# Patient Record
Sex: Female | Born: 1945
Health system: Southern US, Community
[De-identification: ages and names within clinical notes are randomized; demographics above are authoritative.]

## PROBLEM LIST (undated history)

## (undated) DIAGNOSIS — H269 Unspecified cataract: Secondary | ICD-10-CM

## (undated) DIAGNOSIS — K635 Polyp of colon: Secondary | ICD-10-CM

## (undated) DIAGNOSIS — M810 Age-related osteoporosis without current pathological fracture: Secondary | ICD-10-CM

## (undated) DIAGNOSIS — M1990 Unspecified osteoarthritis, unspecified site: Secondary | ICD-10-CM

## (undated) HISTORY — DX: Unspecified cataract: H26.9

## (undated) HISTORY — DX: Polyp of colon: K63.5

## (undated) HISTORY — DX: Unspecified osteoarthritis, unspecified site: M19.90

## (undated) HISTORY — DX: Age-related osteoporosis without current pathological fracture: M81.0

---

## 1974-04-12 HISTORY — PX: TUBAL LIGATION: SHX77

## 2003-03-18 ENCOUNTER — Other Ambulatory Visit: Admission: RE | Admit: 2003-03-18 | Discharge: 2003-03-18 | Payer: Self-pay | Admitting: Obstetrics and Gynecology

## 2003-04-29 HISTORY — PX: COLONOSCOPY: SHX174

## 2004-12-09 ENCOUNTER — Other Ambulatory Visit: Admission: RE | Admit: 2004-12-09 | Discharge: 2004-12-09 | Payer: Self-pay | Admitting: Obstetrics and Gynecology

## 2006-04-12 HISTORY — PX: ACOUSTIC NEUROMA RESECTION: SHX5713

## 2006-09-13 ENCOUNTER — Encounter: Admission: RE | Admit: 2006-09-13 | Discharge: 2006-09-13 | Payer: Self-pay | Admitting: Otolaryngology

## 2008-03-29 ENCOUNTER — Encounter: Admission: RE | Admit: 2008-03-29 | Discharge: 2008-03-29 | Payer: Self-pay | Admitting: Otolaryngology

## 2013-05-17 ENCOUNTER — Encounter: Payer: Self-pay | Admitting: Internal Medicine

## 2013-09-12 ENCOUNTER — Encounter: Payer: Self-pay | Admitting: Internal Medicine

## 2015-05-23 ENCOUNTER — Ambulatory Visit (AMBULATORY_SURGERY_CENTER): Payer: Self-pay | Admitting: *Deleted

## 2015-05-23 VITALS — Ht 65.5 in | Wt 118.0 lb

## 2015-05-23 DIAGNOSIS — Z1211 Encounter for screening for malignant neoplasm of colon: Secondary | ICD-10-CM

## 2015-05-23 NOTE — Progress Notes (Signed)
No egg or soy allergy known to patient  No issues with past sedation with any surgeries  or procedures, no intubation problems  No diet pills per patient No home 02 use per patient  No blood thinners per patient    

## 2015-06-04 ENCOUNTER — Encounter: Payer: Self-pay | Admitting: Gastroenterology

## 2015-06-04 ENCOUNTER — Ambulatory Visit (AMBULATORY_SURGERY_CENTER): Payer: Medicare Other | Admitting: Gastroenterology

## 2015-06-04 VITALS — BP 109/70 | HR 61 | Temp 97.1°F | Resp 13 | Ht 65.5 in | Wt 116.0 lb

## 2015-06-04 DIAGNOSIS — Z1211 Encounter for screening for malignant neoplasm of colon: Secondary | ICD-10-CM

## 2015-06-04 DIAGNOSIS — D120 Benign neoplasm of cecum: Secondary | ICD-10-CM

## 2015-06-04 MED ORDER — SODIUM CHLORIDE 0.9 % IV SOLN: 500.0000 mL | INTRAVENOUS | Status: DC

## 2015-06-04 NOTE — Patient Instructions (Signed)
Discharge instructions given. Handouts on polyps and diverticulosis. Resume previous medications. YOU HAD AN ENDOSCOPIC PROCEDURE TODAY AT [facility]:   Refer to the procedure report that was given to you for any specific questions about what was found during the examination.  If the procedure report does not answer your questions, please call your gastroenterologist to clarify.  If you requested that your care partner not be given the details of your procedure findings, then the procedure report has been included in a sealed envelope for you to review at your convenience later.  YOU SHOULD EXPECT: Some feelings of bloating in the abdomen. Passage of more gas than usual.  Walking can help get rid of the air that was put into your GI tract during the procedure and reduce the bloating. If you had a lower endoscopy (such as a colonoscopy or flexible sigmoidoscopy) you may notice spotting of blood in your stool or on the toilet paper. If you underwent a bowel prep for your procedure, you may not have a normal bowel movement for a few days.  Please Note:  You might notice some irritation and congestion in your nose or some drainage.  This is from the oxygen used during your procedure.  There is no need for concern and it should clear up in a day or so.  SYMPTOMS TO REPORT IMMEDIATELY:   Following lower endoscopy (colonoscopy or flexible sigmoidoscopy):  Excessive amounts of blood in the stool  Significant tenderness or worsening of abdominal pains  Swelling of the abdomen that is new, acute  Fever of 100F or higher   Following upper endoscopy (EGD)  Vomiting of blood or coffee ground material  New chest pain or pain under the shoulder blades  Painful or persistently difficult swallowing  New shortness of breath  Fever of 100F or higher  Black, tarry-looking stools  For urgent or emergent issues, a gastroenterologist can be reached at any hour by calling [phone]  [phone].   DIET: Your first meal following the procedure should be a small meal and then it is ok to progress to your normal diet. Heavy or fried foods are harder to digest and may make you feel nauseous or bloated.  Likewise, meals heavy in dairy and vegetables can increase bloating.  Drink plenty of fluids but you should avoid alcoholic beverages for 24 hours.  ACTIVITY:  You should plan to take it easy for the rest of today and you should NOT DRIVE or use heavy machinery until tomorrow (because of the sedation medicines used during the test).    FOLLOW UP: Our staff will call the number listed on your records the next business day following your procedure to check on you and address any questions or concerns that you may have regarding the information given to you following your procedure. If we do not reach you, we will leave a message.  However, if you are feeling well and you are not experiencing any problems, there is no need to return our call.  We will assume that you have returned to your regular daily activities without incident.  If any biopsies were taken you will be contacted by phone or by letter within the next 1-3 weeks.  Please call us at [phone] if you have not heard about the biopsies in 3 weeks.    SIGNATURES/CONFIDENTIALITY: You and/or your care partner have signed paperwork which will be entered into your electronic medical record.  These signatures attest to the fact that that  the information above on your After Visit Summary has been reviewed and is understood.  Full responsibility of the confidentiality of this discharge information lies with you and/or your care-partner.

## 2015-06-04 NOTE — Progress Notes (Signed)
Report to PACU, RN, vss, BBS= Clear.  

## 2015-06-04 NOTE — Progress Notes (Signed)
Called to room to assist during endoscopic procedure.  Patient ID and intended procedure confirmed with present staff. Received instructions for my participation in the procedure from the performing physician.  

## 2015-06-04 NOTE — Op Note (Signed)
[place]  [address] [place], [id number]   COLONOSCOPY PROCEDURE REPORT  PATIENT: Diana Stephens, Diana Stephens  MR#: [id number] BIRTHDATE: [birth year] , 69  yrs. old GENDER: female ENDOSCOPIST: Wilfrid Lund, MD REFERRED [id number] Ross, M.D. PROCEDURE DATE:  06/04/2015 PROCEDURE:   Colonoscopy, screening and Colonoscopy with biopsy normal colonoscopy January 2005 ASA CLASS:   Class I INDICATIONS:Screening for colonic neoplasia. MEDICATIONS: Monitored anesthesia care and Propofol 200 mg IV  DESCRIPTION OF PROCEDURE:   After the risks benefits and alternatives of the procedure were thoroughly explained, informed consent was obtained.  The digital rectal exam revealed no abnormalities of the rectum.   The [id number]  endoscope was introduced through the anus and advanced to the cecum, which was identified by both the appendix and ileocecal valve. No adverse events experienced.   The quality of the prep was excellent. (Suprep was used)  The instrument was then slowly withdrawn as the colon was fully examined. Estimated blood loss is zero unless otherwise noted in this procedure report.      COLON FINDINGS: A sessile polyp measuring 2 mm in size was found at the cecum. it was completely removed with a cold forceps and retrieved there is extensive right-sided and left-sided diverticulosis The left colon was  redundant and tortuous,requiring external abdominal pressure to reach the cecum. Retroflexion was not performed due to a narrow rectal vault. The time to cecum = 7.2 Withdrawal time = 13.1   The scope was withdrawn and the procedure completed. COMPLICATIONS: There were no immediate complications.  ENDOSCOPIC IMPRESSION: Sessile polyp was found at the cecum ascending colon and sigmoid colon diverticulosis RECOMMENDATIONS: Colonoscopy interval dependent on polyp results  eSigned:  Wilfrid Lund, MD 06/04/2015 11:49 AM   cc:

## 2015-06-05 ENCOUNTER — Telehealth: Payer: Self-pay | Admitting: *Deleted

## 2015-06-05 NOTE — Telephone Encounter (Signed)
  Follow up Call-  Call back number 06/04/2015  Post procedure Call Back phone  # [phone]  Permission to leave phone message Yes     Patient questions:  Do you have a fever, pain , or abdominal swelling? No. Pain Score  0 *  Have you tolerated food without any problems? Yes.    Have you been able to return to your normal activities? Yes.    Do you have any questions about your discharge instructions: Diet   No. Medications  No. Follow up visit  No.  Do you have questions or concerns about your Care? No.  Actions: * If pain score is 4 or above: No action needed, pain <4.

## 2015-06-10 ENCOUNTER — Encounter: Payer: Self-pay | Admitting: Gastroenterology

## 2016-04-19 DIAGNOSIS — Z131 Encounter for screening for diabetes mellitus: Secondary | ICD-10-CM | POA: Diagnosis not present

## 2016-04-19 DIAGNOSIS — E7800 Pure hypercholesterolemia, unspecified: Secondary | ICD-10-CM | POA: Diagnosis not present

## 2016-05-05 DIAGNOSIS — Z131 Encounter for screening for diabetes mellitus: Secondary | ICD-10-CM | POA: Diagnosis not present

## 2016-05-05 DIAGNOSIS — Z0000 Encounter for general adult medical examination without abnormal findings: Secondary | ICD-10-CM | POA: Diagnosis not present

## 2016-05-05 DIAGNOSIS — E7800 Pure hypercholesterolemia, unspecified: Secondary | ICD-10-CM | POA: Diagnosis not present

## 2016-06-14 DIAGNOSIS — Z01419 Encounter for gynecological examination (general) (routine) without abnormal findings: Secondary | ICD-10-CM | POA: Diagnosis not present

## 2016-06-14 DIAGNOSIS — N8189 Other female genital prolapse: Secondary | ICD-10-CM | POA: Diagnosis not present

## 2016-06-14 DIAGNOSIS — N393 Stress incontinence (female) (male): Secondary | ICD-10-CM | POA: Diagnosis not present

## 2016-06-14 DIAGNOSIS — Z1231 Encounter for screening mammogram for malignant neoplasm of breast: Secondary | ICD-10-CM | POA: Diagnosis not present

## 2016-06-14 DIAGNOSIS — Z6820 Body mass index (BMI) 20.0-20.9, adult: Secondary | ICD-10-CM | POA: Diagnosis not present

## 2016-06-22 DIAGNOSIS — L821 Other seborrheic keratosis: Secondary | ICD-10-CM | POA: Diagnosis not present

## 2016-06-22 DIAGNOSIS — L814 Other melanin hyperpigmentation: Secondary | ICD-10-CM | POA: Diagnosis not present

## 2016-06-22 DIAGNOSIS — D2261 Melanocytic nevi of right upper limb, including shoulder: Secondary | ICD-10-CM | POA: Diagnosis not present

## 2016-06-22 DIAGNOSIS — L570 Actinic keratosis: Secondary | ICD-10-CM | POA: Diagnosis not present

## 2016-06-22 DIAGNOSIS — D2272 Melanocytic nevi of left lower limb, including hip: Secondary | ICD-10-CM | POA: Diagnosis not present

## 2017-02-02 DIAGNOSIS — R69 Illness, unspecified: Secondary | ICD-10-CM | POA: Diagnosis not present

## 2017-02-09 DIAGNOSIS — H2513 Age-related nuclear cataract, bilateral: Secondary | ICD-10-CM | POA: Diagnosis not present

## 2017-05-09 DIAGNOSIS — E7800 Pure hypercholesterolemia, unspecified: Secondary | ICD-10-CM | POA: Diagnosis not present

## 2017-05-09 DIAGNOSIS — Z131 Encounter for screening for diabetes mellitus: Secondary | ICD-10-CM | POA: Diagnosis not present

## 2017-05-09 DIAGNOSIS — Z0000 Encounter for general adult medical examination without abnormal findings: Secondary | ICD-10-CM | POA: Diagnosis not present

## 2017-06-22 DIAGNOSIS — N3945 Continuous leakage: Secondary | ICD-10-CM | POA: Diagnosis not present

## 2017-06-22 DIAGNOSIS — Z1231 Encounter for screening mammogram for malignant neoplasm of breast: Secondary | ICD-10-CM | POA: Diagnosis not present

## 2017-06-22 DIAGNOSIS — Z681 Body mass index (BMI) 19 or less, adult: Secondary | ICD-10-CM | POA: Diagnosis not present

## 2017-06-22 DIAGNOSIS — Z124 Encounter for screening for malignant neoplasm of cervix: Secondary | ICD-10-CM | POA: Diagnosis not present

## 2017-06-22 DIAGNOSIS — Z780 Asymptomatic menopausal state: Secondary | ICD-10-CM | POA: Diagnosis not present

## 2017-06-22 DIAGNOSIS — N8189 Other female genital prolapse: Secondary | ICD-10-CM | POA: Diagnosis not present

## 2017-09-06 DIAGNOSIS — R69 Illness, unspecified: Secondary | ICD-10-CM | POA: Diagnosis not present

## 2017-09-08 DIAGNOSIS — Z79899 Other long term (current) drug therapy: Secondary | ICD-10-CM | POA: Diagnosis not present

## 2017-09-08 DIAGNOSIS — M5416 Radiculopathy, lumbar region: Secondary | ICD-10-CM | POA: Diagnosis not present

## 2017-09-13 DIAGNOSIS — D225 Melanocytic nevi of trunk: Secondary | ICD-10-CM | POA: Diagnosis not present

## 2017-09-13 DIAGNOSIS — D692 Other nonthrombocytopenic purpura: Secondary | ICD-10-CM | POA: Diagnosis not present

## 2017-09-13 DIAGNOSIS — D2261 Melanocytic nevi of right upper limb, including shoulder: Secondary | ICD-10-CM | POA: Diagnosis not present

## 2017-09-13 DIAGNOSIS — L821 Other seborrheic keratosis: Secondary | ICD-10-CM | POA: Diagnosis not present

## 2017-09-20 DIAGNOSIS — R944 Abnormal results of kidney function studies: Secondary | ICD-10-CM | POA: Diagnosis not present

## 2017-10-05 DIAGNOSIS — R69 Illness, unspecified: Secondary | ICD-10-CM | POA: Diagnosis not present

## 2017-10-25 DIAGNOSIS — N390 Urinary tract infection, site not specified: Secondary | ICD-10-CM | POA: Diagnosis not present

## 2017-10-25 DIAGNOSIS — R319 Hematuria, unspecified: Secondary | ICD-10-CM | POA: Diagnosis not present

## 2017-10-26 ENCOUNTER — Other Ambulatory Visit: Payer: Self-pay | Admitting: Family Medicine

## 2017-10-26 DIAGNOSIS — M5416 Radiculopathy, lumbar region: Secondary | ICD-10-CM

## 2017-11-01 ENCOUNTER — Ambulatory Visit
Admission: RE | Admit: 2017-11-01 | Discharge: 2017-11-01 | Disposition: A | Discharge status: Home / Self Care | Payer: Medicare HMO | Source: Ambulatory Visit | Attending: Family Medicine | Admitting: Family Medicine

## 2017-11-01 DIAGNOSIS — M5416 Radiculopathy, lumbar region: Secondary | ICD-10-CM

## 2017-11-04 DIAGNOSIS — M5416 Radiculopathy, lumbar region: Secondary | ICD-10-CM | POA: Diagnosis not present

## 2017-11-04 DIAGNOSIS — N390 Urinary tract infection, site not specified: Secondary | ICD-10-CM | POA: Diagnosis not present

## 2017-12-13 DIAGNOSIS — M545 Low back pain: Secondary | ICD-10-CM | POA: Diagnosis not present

## 2017-12-21 DIAGNOSIS — N390 Urinary tract infection, site not specified: Secondary | ICD-10-CM | POA: Diagnosis not present

## 2017-12-21 DIAGNOSIS — R944 Abnormal results of kidney function studies: Secondary | ICD-10-CM | POA: Diagnosis not present

## 2018-01-09 DIAGNOSIS — M545 Low back pain: Secondary | ICD-10-CM | POA: Diagnosis not present

## 2018-01-18 DIAGNOSIS — M545 Low back pain: Secondary | ICD-10-CM | POA: Diagnosis not present

## 2018-02-01 DIAGNOSIS — M545 Low back pain: Secondary | ICD-10-CM | POA: Diagnosis not present

## 2018-02-09 DIAGNOSIS — H2513 Age-related nuclear cataract, bilateral: Secondary | ICD-10-CM | POA: Diagnosis not present

## 2018-02-09 DIAGNOSIS — H5201 Hypermetropia, right eye: Secondary | ICD-10-CM | POA: Diagnosis not present

## 2018-02-09 DIAGNOSIS — H5212 Myopia, left eye: Secondary | ICD-10-CM | POA: Diagnosis not present

## 2018-02-13 DIAGNOSIS — M545 Low back pain: Secondary | ICD-10-CM | POA: Diagnosis not present

## 2018-02-22 DIAGNOSIS — M545 Low back pain: Secondary | ICD-10-CM | POA: Diagnosis not present

## 2018-03-01 DIAGNOSIS — M545 Low back pain: Secondary | ICD-10-CM | POA: Diagnosis not present

## 2018-03-15 DIAGNOSIS — M545 Low back pain: Secondary | ICD-10-CM | POA: Diagnosis not present

## 2018-04-07 DIAGNOSIS — N3001 Acute cystitis with hematuria: Secondary | ICD-10-CM | POA: Diagnosis not present

## 2018-04-07 DIAGNOSIS — R350 Frequency of micturition: Secondary | ICD-10-CM | POA: Diagnosis not present

## 2018-04-07 DIAGNOSIS — R300 Dysuria: Secondary | ICD-10-CM | POA: Diagnosis not present

## 2018-04-25 DIAGNOSIS — R69 Illness, unspecified: Secondary | ICD-10-CM | POA: Diagnosis not present

## 2018-05-11 DIAGNOSIS — H25812 Combined forms of age-related cataract, left eye: Secondary | ICD-10-CM | POA: Diagnosis not present

## 2018-05-11 DIAGNOSIS — H2512 Age-related nuclear cataract, left eye: Secondary | ICD-10-CM | POA: Diagnosis not present

## 2018-06-01 DIAGNOSIS — H52201 Unspecified astigmatism, right eye: Secondary | ICD-10-CM | POA: Diagnosis not present

## 2018-06-01 DIAGNOSIS — H25811 Combined forms of age-related cataract, right eye: Secondary | ICD-10-CM | POA: Diagnosis not present

## 2018-06-01 DIAGNOSIS — H2511 Age-related nuclear cataract, right eye: Secondary | ICD-10-CM | POA: Diagnosis not present

## 2018-06-15 DIAGNOSIS — Z131 Encounter for screening for diabetes mellitus: Secondary | ICD-10-CM | POA: Diagnosis not present

## 2018-06-15 DIAGNOSIS — E7800 Pure hypercholesterolemia, unspecified: Secondary | ICD-10-CM | POA: Diagnosis not present

## 2018-06-19 DIAGNOSIS — Z0000 Encounter for general adult medical examination without abnormal findings: Secondary | ICD-10-CM | POA: Diagnosis not present

## 2018-09-20 DIAGNOSIS — M8588 Other specified disorders of bone density and structure, other site: Secondary | ICD-10-CM | POA: Diagnosis not present

## 2018-09-20 DIAGNOSIS — N958 Other specified menopausal and perimenopausal disorders: Secondary | ICD-10-CM | POA: Diagnosis not present

## 2018-09-20 DIAGNOSIS — Z6820 Body mass index (BMI) 20.0-20.9, adult: Secondary | ICD-10-CM | POA: Diagnosis not present

## 2018-09-20 DIAGNOSIS — Z01419 Encounter for gynecological examination (general) (routine) without abnormal findings: Secondary | ICD-10-CM | POA: Diagnosis not present

## 2018-09-20 DIAGNOSIS — Z1231 Encounter for screening mammogram for malignant neoplasm of breast: Secondary | ICD-10-CM | POA: Diagnosis not present

## 2018-11-14 DIAGNOSIS — D2261 Melanocytic nevi of right upper limb, including shoulder: Secondary | ICD-10-CM | POA: Diagnosis not present

## 2018-11-14 DIAGNOSIS — D225 Melanocytic nevi of trunk: Secondary | ICD-10-CM | POA: Diagnosis not present

## 2018-11-14 DIAGNOSIS — L821 Other seborrheic keratosis: Secondary | ICD-10-CM | POA: Diagnosis not present

## 2018-11-14 DIAGNOSIS — D692 Other nonthrombocytopenic purpura: Secondary | ICD-10-CM | POA: Diagnosis not present

## 2018-11-14 DIAGNOSIS — L720 Epidermal cyst: Secondary | ICD-10-CM | POA: Diagnosis not present

## 2019-02-05 DIAGNOSIS — R69 Illness, unspecified: Secondary | ICD-10-CM | POA: Diagnosis not present

## 2019-02-09 DIAGNOSIS — Z961 Presence of intraocular lens: Secondary | ICD-10-CM | POA: Diagnosis not present

## 2019-02-20 DIAGNOSIS — R69 Illness, unspecified: Secondary | ICD-10-CM | POA: Diagnosis not present

## 2019-02-22 DIAGNOSIS — M62838 Other muscle spasm: Secondary | ICD-10-CM | POA: Diagnosis not present

## 2019-02-22 DIAGNOSIS — M542 Cervicalgia: Secondary | ICD-10-CM | POA: Diagnosis not present

## 2019-04-17 DIAGNOSIS — R69 Illness, unspecified: Secondary | ICD-10-CM | POA: Diagnosis not present

## 2019-04-29 ENCOUNTER — Encounter: Payer: Self-pay | Admitting: *Deleted

## 2019-04-29 ENCOUNTER — Ambulatory Visit: Payer: Medicare Other | Attending: Internal Medicine

## 2019-04-29 DIAGNOSIS — Z23 Encounter for immunization: Secondary | ICD-10-CM

## 2019-04-29 NOTE — Progress Notes (Unsigned)
   Covid-19 Vaccination Clinic  Name:  Lashena Inclan    MRN: [id number] DOB: [birth year]  04/29/2019  Ms. Tschida was observed post Covid-19 immunization for 15 minutes without incidence. She was provided with Vaccine Information Sheet and instruction to access the V-Safe system.   Ms. Gatz was instructed to call 911 with any severe reactions post vaccine: Marland Kitchen Difficulty breathing  . Swelling of your face and throat  . A fast heartbeat  . A bad rash all over your body  . Dizziness and weakness

## 2019-05-17 ENCOUNTER — Ambulatory Visit: Payer: Medicare HMO | Attending: Internal Medicine

## 2019-05-17 DIAGNOSIS — Z23 Encounter for immunization: Secondary | ICD-10-CM

## 2019-05-17 NOTE — Progress Notes (Signed)
   Covid-19 Vaccination Clinic  Name:  Diana Stephens    MRN: [id number] DOB: 12-21-1945  05/17/2019  Diana Stephens was observed post Covid-19 immunization for 15 minutes without incidence. She was provided with Vaccine Information Sheet and instruction to access the V-Safe system.   Diana Stephens was instructed to call 911 with any severe reactions post vaccine: Marland Kitchen Difficulty breathing  . Swelling of your face and throat  . A fast heartbeat  . A bad rash all over your body  . Dizziness and weakness    Immunizations Administered    Name Date Dose VIS Date Route   Pfizer COVID-19 Vaccine 05/17/2019 11:09 AM 0.3 mL 03/23/2019 Intramuscular   Manufacturer: [place]   Lot: [id number]   [place]: [id number]

## 2019-06-26 DIAGNOSIS — R69 Illness, unspecified: Secondary | ICD-10-CM | POA: Diagnosis not present

## 2019-06-29 IMAGING — MR MR LUMBAR SPINE W/O CM
5 series · 48 of 48 positions shown · non-contrast
Comparison: Lumbar spine radiographs 09/08/2017

CLINICAL DATA: Acute right lumbar radiculopathy. Progressive
symptoms with walking.

EXAM:
MRI LUMBAR SPINE WITHOUT CONTRAST
TECHNIQUE: Multiplanar, multisequence MR imaging of the lumbar spine was
performed. No intravenous contrast was administered.

[Series 3: T2 · sagittal · 4.0mm · 0.81mm/px · 6 of 12 slices shown (1 of 2)]
[im 1/12]
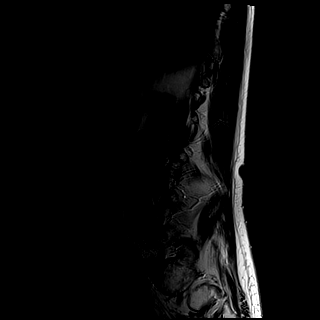
[im 3/12]
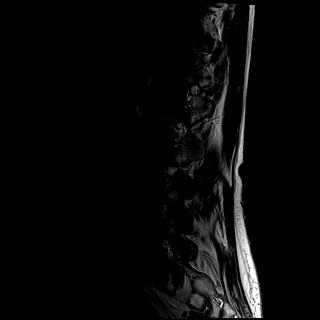
[im 5/12]
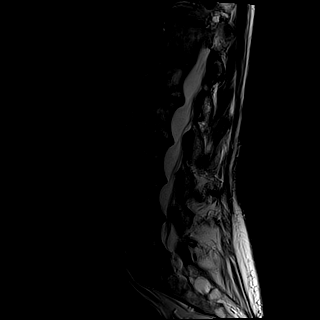
[im 7/12]
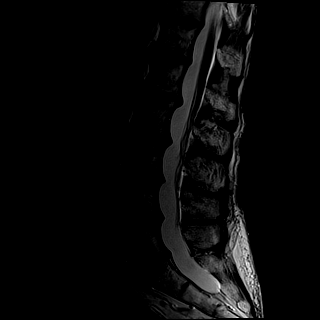
[im 9/12]
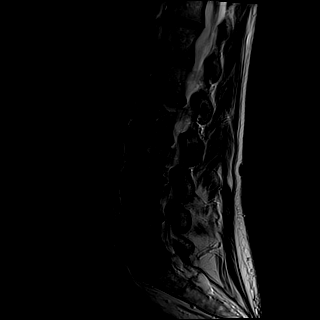
[im 12/12]
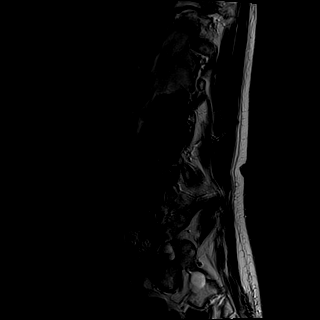

[Series 4: tirm sag · sagittal · 4.0mm · 0.81mm/px · 5 of 12 slices shown]
[im 1/12]
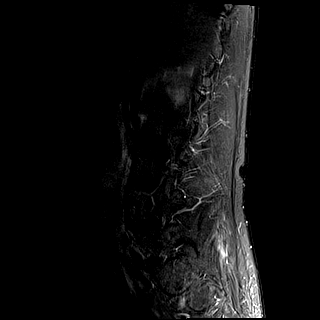
[im 3/12]
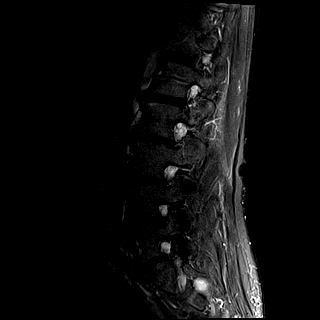
[im 6/12]
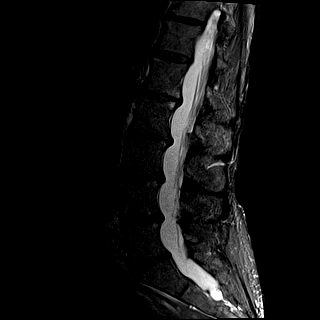
[im 9/12]
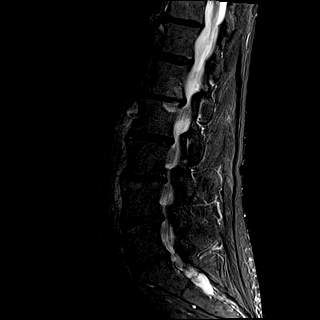
[im 12/12]
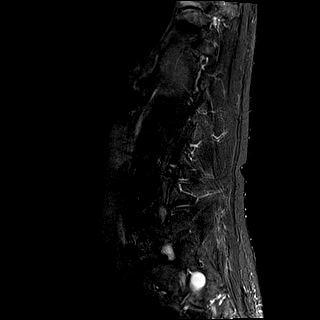

[Series 5: T1 · sagittal · 4.0mm · 0.81mm/px · 5 of 12 slices shown (1 of 2)]
[im 1/12]
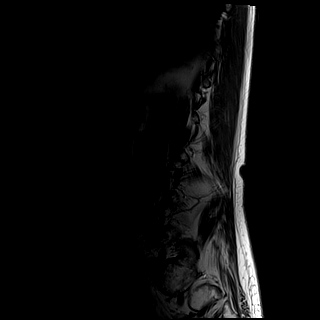
[im 3/12]
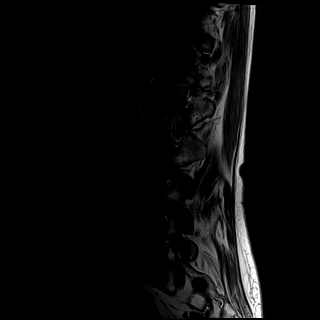
[im 6/12]
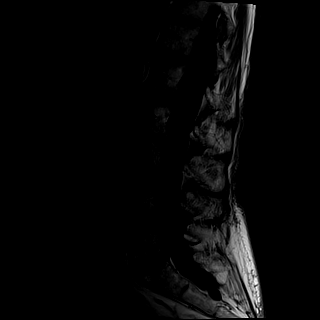
[im 9/12]
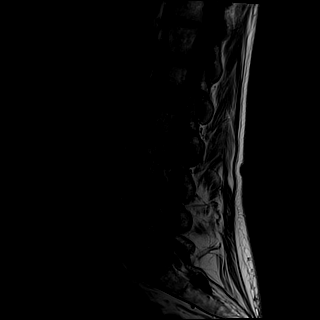
[im 12/12]
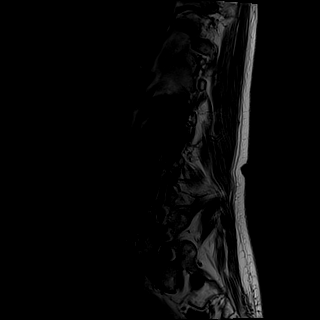

[Series 6: T2 · axial · 4.0mm · 0.70mm/px · z∈[-65,+142]mm · 16 of 38 slices shown (2 of 2)]
[im 1/38]
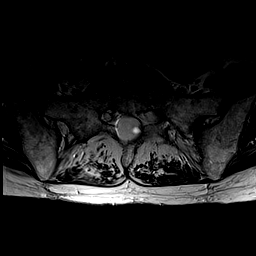
[im 3/38]
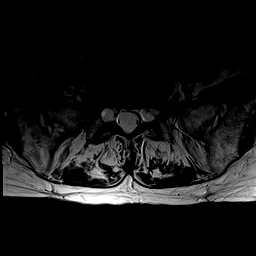
[im 5/38]
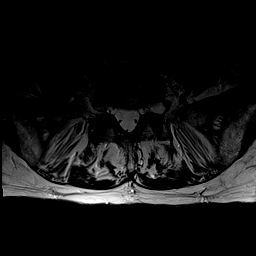
[im 8/38]
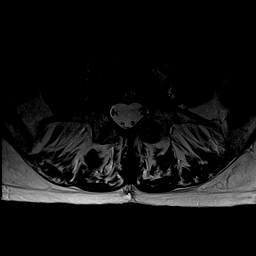
[im 10/38]
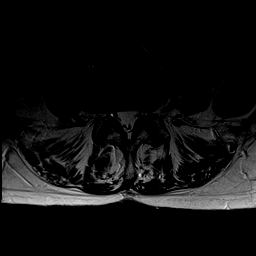
[im 13/38]
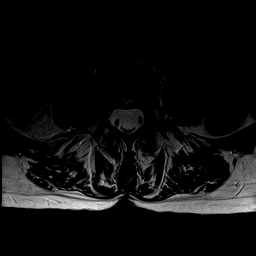
[im 15/38]
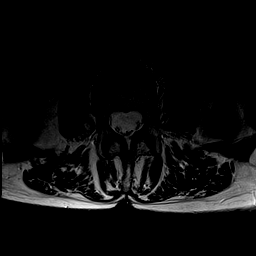
[im 18/38]
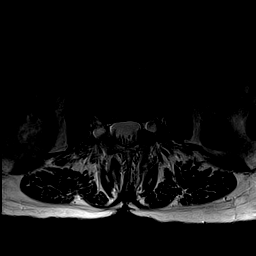
[im 20/38]
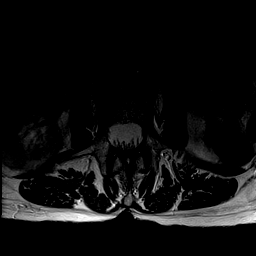
[im 23/38]
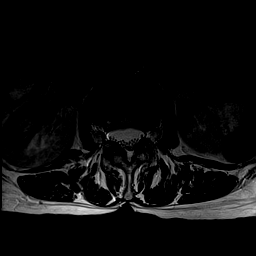
[im 25/38]
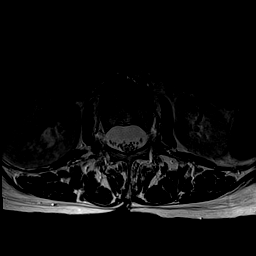
[im 28/38]
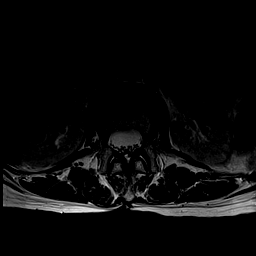
[im 30/38]
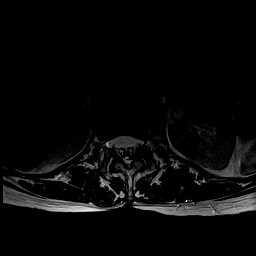
[im 33/38]
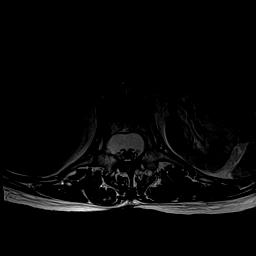
[im 35/38]
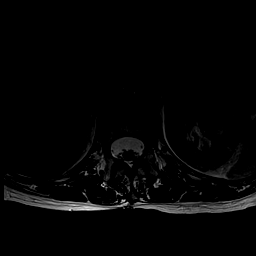
[im 38/38]
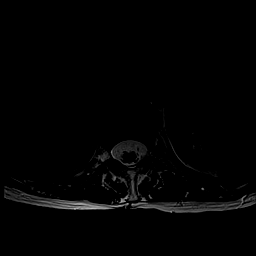

[Series 7: T1 · axial · 4.0mm · 0.70mm/px · z∈[-65,+142]mm · 16 of 38 slices shown (2 of 2)]
[im 1/38]
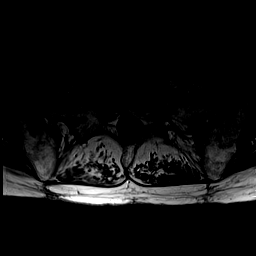
[im 3/38]
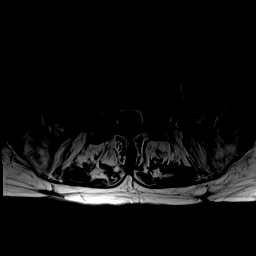
[im 5/38]
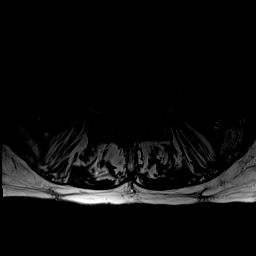
[im 8/38]
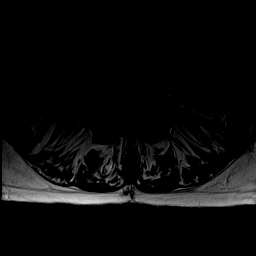
[im 10/38]
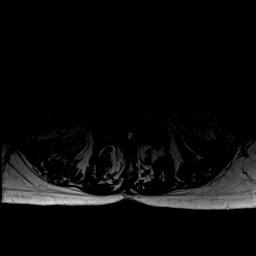
[im 13/38]
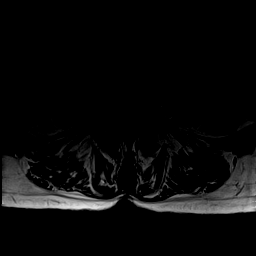
[im 15/38]
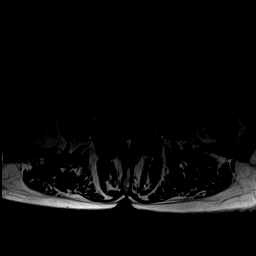
[im 18/38]
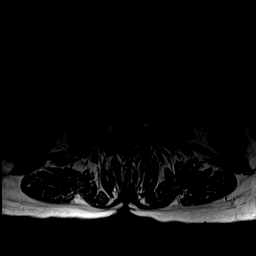
[im 20/38]
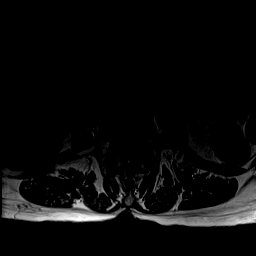
[im 23/38]
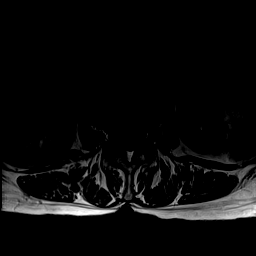
[im 25/38]
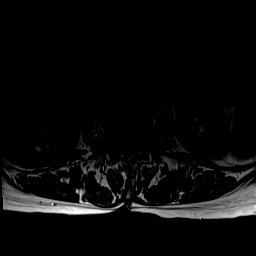
[im 28/38]
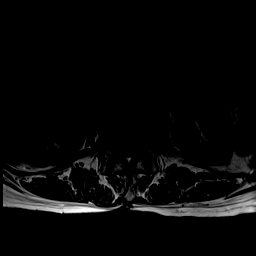
[im 30/38]
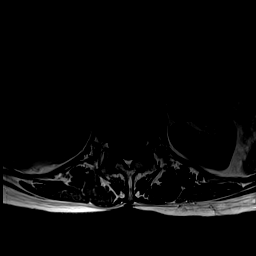
[im 33/38]
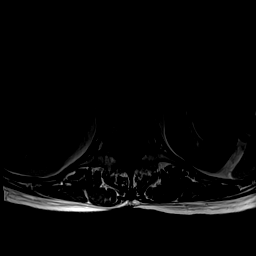
[im 35/38]
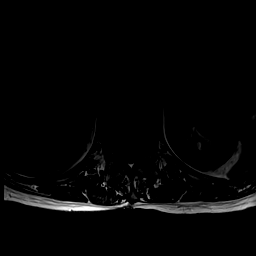
[im 38/38]
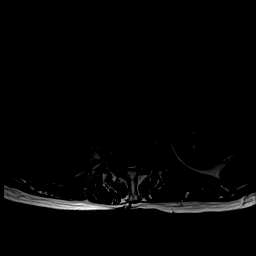

[48 of 48 positions shown; findings below may reference images not displayed]

FINDINGS: Segmentation: 5 non rib-bearing lumbar type vertebral bodies are
present. The lowest fully formed vertebral body is L5.

Alignment: Slight retrolisthesis is present at L1-2. AP alignment is
otherwise anatomic.

Vertebrae: Heterogeneous marrow signal is present without focal
lytic or blastic lesions.

Conus medullaris and cauda equina: Conus extends to the L1-2 level.
Conus and cauda equina appear normal.

Paraspinal and other soft tissues: Limited imaging the abdomen is
unremarkable. No significant adenopathy is present.

Disc levels:

T12-L1: Negative.

L1-2: Mild broad-based disc protrusion is present. Mild facet
hypertrophy is noted bilaterally.

L2-3: Mild disc bulging and facet hypertrophy is present. There is
chronic loss of disc height. No focal stenosis is present.

L3-4: Broad-based disc protrusion present. Mild facet hypertrophy is
noted. Perineural root sleeve cysts are noted bilaterally. Mild left
foraminal narrowing is present.

L4-5: A rightward disc protrusion is present. Mild facet hypertrophy
is noted bilaterally. Central canal is patent. Mild foraminal
narrowing is noted bilaterally.

L5-S1: Mild disc bulging and facet hypertrophy is present.
Perineural root sleeve cysts are present bilaterally. There is no
focal stenosis.

Tarlov cysts are present at S1-2 bilaterally.
IMPRESSION: 1. Mild left foraminal narrowing at L3-4.
2. Mild foraminal narrowing bilaterally at L4-5
3. Chronic loss of disc height with mild bulging and bilateral facet
arthropathy bilaterally through the remainder lumbar spine but no
other focal stenosis.

## 2019-07-20 DIAGNOSIS — R300 Dysuria: Secondary | ICD-10-CM | POA: Diagnosis not present

## 2019-07-20 DIAGNOSIS — N3001 Acute cystitis with hematuria: Secondary | ICD-10-CM | POA: Diagnosis not present

## 2019-07-26 DIAGNOSIS — E7800 Pure hypercholesterolemia, unspecified: Secondary | ICD-10-CM | POA: Diagnosis not present

## 2019-07-26 DIAGNOSIS — R944 Abnormal results of kidney function studies: Secondary | ICD-10-CM | POA: Diagnosis not present

## 2019-07-31 DIAGNOSIS — Z0000 Encounter for general adult medical examination without abnormal findings: Secondary | ICD-10-CM | POA: Diagnosis not present

## 2019-10-02 DIAGNOSIS — Z1231 Encounter for screening mammogram for malignant neoplasm of breast: Secondary | ICD-10-CM | POA: Diagnosis not present

## 2019-10-08 ENCOUNTER — Other Ambulatory Visit: Payer: Self-pay | Admitting: Obstetrics and Gynecology

## 2019-10-08 DIAGNOSIS — R928 Other abnormal and inconclusive findings on diagnostic imaging of breast: Secondary | ICD-10-CM

## 2019-10-11 ENCOUNTER — Ambulatory Visit
Admission: RE | Admit: 2019-10-11 | Discharge: 2019-10-11 | Disposition: A | Discharge status: Home / Self Care | Payer: Medicare HMO | Source: Ambulatory Visit | Attending: Obstetrics and Gynecology | Admitting: Obstetrics and Gynecology

## 2019-10-11 ENCOUNTER — Other Ambulatory Visit: Payer: Self-pay

## 2019-10-11 ENCOUNTER — Ambulatory Visit: Payer: Medicare HMO

## 2019-10-11 DIAGNOSIS — R928 Other abnormal and inconclusive findings on diagnostic imaging of breast: Secondary | ICD-10-CM

## 2019-10-11 DIAGNOSIS — R922 Inconclusive mammogram: Secondary | ICD-10-CM | POA: Diagnosis not present

## 2019-10-18 ENCOUNTER — Other Ambulatory Visit: Payer: Medicare HMO

## 2019-11-13 DIAGNOSIS — R079 Chest pain, unspecified: Secondary | ICD-10-CM | POA: Diagnosis not present

## 2019-11-13 DIAGNOSIS — M25559 Pain in unspecified hip: Secondary | ICD-10-CM | POA: Diagnosis not present

## 2019-11-13 DIAGNOSIS — M542 Cervicalgia: Secondary | ICD-10-CM | POA: Diagnosis not present

## 2019-12-03 DIAGNOSIS — L814 Other melanin hyperpigmentation: Secondary | ICD-10-CM | POA: Diagnosis not present

## 2019-12-03 DIAGNOSIS — L738 Other specified follicular disorders: Secondary | ICD-10-CM | POA: Diagnosis not present

## 2019-12-03 DIAGNOSIS — L821 Other seborrheic keratosis: Secondary | ICD-10-CM | POA: Diagnosis not present

## 2019-12-03 DIAGNOSIS — D225 Melanocytic nevi of trunk: Secondary | ICD-10-CM | POA: Diagnosis not present

## 2019-12-03 DIAGNOSIS — D2261 Melanocytic nevi of right upper limb, including shoulder: Secondary | ICD-10-CM | POA: Diagnosis not present

## 2020-01-13 ENCOUNTER — Encounter: Payer: Self-pay | Admitting: Cardiovascular Disease

## 2020-01-13 NOTE — Progress Notes (Signed)
Cardiology Office Note:    Date:  01/14/2020   ID:  Diana Stephens, DOB 08/26/1945, MRN [id number]  PCP:  Lawerance Cruel, MD  Echo Cardiologist:  No primary care provider on file.  [facility] Electrophysiologist:  None   Referring MD: Lawerance Cruel, MD   Chief Complaint  Patient presents with   Chest Pain     Oct. 4, 2021   Diana Stephens is a 74 y.o. female with a hx of intermittent chest pain . We are asked to see her for further evaluation of her chest pain by Dr. Harrington Challenger.  She has been under lots of stress taking care of husband , Diana Stephens ,- who has progressive dementia . She recently saw Dr. Harrington Challenger.  BP was elevated at the time. Has had some shooting chest pain in the remote past .  Only lasts for a second or so  Her kids are telling her that she needs to move into a smaller house.   She used to go to the gym 5 days a week but has not been going out much .   Has to stay with her husband.  Encouraged her to get out and exercise more      Past Medical History:  Diagnosis Date   Cataract    small bilaterally   Osteoporosis     Past Surgical History:  Procedure Laterality Date   ACOUSTIC NEUROMA RESECTION  2008   COLONOSCOPY  04-29-2003   tics   TUBAL LIGATION  1976   VAGINAL DELIVERY     x2- 72,76    Current Medications: Current Meds  Medication Sig   aspirin 81 MG chewable tablet Chew 81 mg by mouth daily.   Calcium Citrate (CITRACAL PO) Take 1 capsule by mouth daily.   cholecalciferol (VITAMIN D) 1000 units tablet Take 1,000 Units by mouth daily.   Cyanocobalamin (VITAMIN B 12 PO) Take 1 capsule by mouth daily.   Multiple Vitamins-Minerals (CENTRUM SILVER ADULT 50+ PO) Take 1 capsule by mouth daily.   Omega-3 Fatty Acids (FISH OIL PO) Take 1 capsule by mouth daily.     Allergies:   Patient has no known allergies.   Social History   Socioeconomic History   Marital status: Married    Spouse name: Not on file    Number of children: Not on file   Years of education: Not on file   Highest education level: Not on file  Occupational History   Not on file  Tobacco Use   Smoking status: Former Smoker   Smokeless tobacco: Never Used  Substance and Sexual Activity   Alcohol use: Yes    Alcohol/week: 2.0 standard drinks    Types: 2 Standard drinks or equivalent per week    Comment:  1-2 glass of wine a week    Drug use: No   Sexual activity: Not on file  Other Topics Concern   Not on file  Social History Narrative   Not on file   Social Determinants of Health   Financial Resource Strain:    Difficulty of Paying Living Expenses: Not on file  Food Insecurity:    Worried About [place] in the Last Year: Not on file   Ran Out of Food in the Last Year: Not on file  Transportation Needs:    Lack of Transportation (Medical): Not on file   Lack of Transportation (Non-Medical): Not on file  Physical Activity:    Days of Exercise per Week:  Not on file   Minutes of Exercise per Session: Not on file  Stress:    Feeling of Stress : Not on file  Social Connections:    Frequency of Communication with Friends and Family: Not on file   Frequency of Social Gatherings with Friends and Family: Not on file   Attends Religious Services: Not on file   Active Member of Clubs or Organizations: Not on file   Attends Archivist Meetings: Not on file   Marital Status: Not on file     Family History: The patient's family history includes Alzheimer's disease in her mother; Diabetes in her father and mother; Heart disease in her father. There is no history of Colon cancer or Colon polyps.  ROS:   Please see the history of present illness.     All other systems reviewed and are negative.  EKGs/Labs/Other Studies Reviewed:    The following studies were reviewed today: Labs from Dr. Harrington Challenger Potassium and other labs are  normal   EKG:  Oct. 4, 2021: NSR at 74.    Occasion PVCs.   Recent Labs: No results found for requested labs within last 8760 hours.  Recent Lipid Panel No results found for: CHOL, TRIG, HDL, CHOLHDL, VLDL, LDLCALC, LDLDIRECT  Physical Exam:    VS:  BP 128/88    Pulse 74    Ht 5\' 4"  (1.626 m)    Wt 117 lb 6.4 oz (53.3 kg)    SpO2 99%    BMI 20.15 kg/m     Wt Readings from Last 3 Encounters:  01/14/20 117 lb 6.4 oz (53.3 kg)  06/04/15 116 lb (52.6 kg)  05/23/15 118 lb (53.5 kg)     GEN:  Well nourished, well developed in no acute distress HEENT: Normal NECK: No JVD; No carotid bruits LYMPHATICS: No lymphadenopathy CARDIAC: RRR, no murmurs, rubs, gallops RESPIRATORY:  Clear to auscultation without rales, wheezing or rhonchi  ABDOMEN: Soft, non-tender, non-distended MUSCULOSKELETAL:  No edema; No deformity  SKIN: Warm and dry NEUROLOGIC:  Alert and oriented x 3 PSYCHIATRIC:  Normal affect   ASSESSMENT:    1. Essential hypertension   2. PVC (premature ventricular contraction)   3. Primary hypertension    PLAN:    In order of problems listed above:  1. Mildly hypertension: She has mild hypertension.  She has been under lots of stress and has not been exercising.  She avoids salt for the most part.  Encouraged her to work on a regular exercise program.  She will get a blood pressure cuff and will monitor her blood pressure daily.  I will see her again in 3 to 4 months and will review her blood pressure readings.  If her blood pressure is mildly elevated then will consider starting on a low-dose ARB.  2.  Premature ventricular contractions.  She had rare premature ventricular contractions on her EKG today.  Her potassium level recently with her primary medical doctor was normal.  These are benign.   Medication Adjustments/Labs and Tests Ordered: Current medicines are reviewed at length with the patient today.  Concerns regarding medicines are outlined above.  Orders Placed This Encounter  Procedures   EKG 12-Lead    No orders of the defined types were placed in this encounter.   Patient Instructions  Omron BP cuff Take BP 1 time a day - while relaxed Record on a notepad Bring with you at your next visit   Medication Instructions:  Your physician recommends that  you continue on your current medications as directed. Please refer to the Current Medication list given to you today.  *If you need a refill on your cardiac medications before your next appointment, please call your pharmacy*   Lab Work: None Ordered If you have labs (blood work) drawn today and your tests are completely normal, you will receive your results only by:  Day (if you have MyChart) OR  A paper copy in the mail If you have any lab test that is abnormal or we need to change your treatment, we will call you to review the results.   Testing/Procedures: None Ordered   Follow-Up: At [facility], you and your health needs are our priority.  As part of our continuing mission to provide you with exceptional heart care, we have created designated Provider Care Teams.  These Care Teams include your primary Cardiologist (physician) and Advanced Practice Providers (APPs -  Physician Assistants and Nurse Practitioners) who all work together to provide you with the care you need, when you need it.     Your next appointment:   3 month(s) on January 6 at 10:00  The format for your next appointment:   In Person  Provider:   Mertie Moores, MD        Signed, Mertie Moores, MD  01/14/2020 9:30 AM    [place]

## 2020-01-14 ENCOUNTER — Other Ambulatory Visit: Payer: Self-pay

## 2020-01-14 ENCOUNTER — Encounter: Payer: Self-pay | Admitting: Cardiovascular Disease

## 2020-01-14 ENCOUNTER — Ambulatory Visit (INDEPENDENT_AMBULATORY_CARE_PROVIDER_SITE_OTHER): Payer: Medicare HMO | Admitting: Cardiovascular Disease

## 2020-01-14 VITALS — BP 128/88 | HR 74 | Ht 64.0 in | Wt 117.4 lb

## 2020-01-14 DIAGNOSIS — I10 Essential (primary) hypertension: Secondary | ICD-10-CM | POA: Diagnosis not present

## 2020-01-14 DIAGNOSIS — I493 Ventricular premature depolarization: Secondary | ICD-10-CM

## 2020-01-14 NOTE — Patient Instructions (Addendum)
Omron BP cuff Take BP 1 time a day - while relaxed Record on a notepad Bring with you at your next visit   Medication Instructions:  Your physician recommends that you continue on your current medications as directed. Please refer to the Current Medication list given to you today.  *If you need a refill on your cardiac medications before your next appointment, please call your pharmacy*   Lab Work: None Ordered If you have labs (blood work) drawn today and your tests are completely normal, you will receive your results only by:  [place] (if you have MyChart) OR  A paper copy in the mail If you have any lab test that is abnormal or we need to change your treatment, we will call you to review the results.   Testing/Procedures: None Ordered   Follow-Up: At [facility], you and your health needs are our priority.  As part of our continuing mission to provide you with exceptional heart care, we have created designated Provider Care Teams.  These Care Teams include your primary Cardiologist (physician) and Advanced Practice Providers (APPs -  Physician Assistants and Nurse Practitioners) who all work together to provide you with the care you need, when you need it.     Your next appointment:   3 month(s) on January 6 at 10:00  The format for your next appointment:   In Person  Provider:   Mertie Moores, MD

## 2020-02-08 DIAGNOSIS — Z961 Presence of intraocular lens: Secondary | ICD-10-CM | POA: Diagnosis not present

## 2020-02-08 DIAGNOSIS — H0012 Chalazion right lower eyelid: Secondary | ICD-10-CM | POA: Diagnosis not present

## 2020-02-21 DIAGNOSIS — I10 Essential (primary) hypertension: Secondary | ICD-10-CM | POA: Diagnosis not present

## 2020-02-21 DIAGNOSIS — Z681 Body mass index (BMI) 19 or less, adult: Secondary | ICD-10-CM | POA: Diagnosis not present

## 2020-02-27 DIAGNOSIS — H90A21 Sensorineural hearing loss, unilateral, right ear, with restricted hearing on the contralateral side: Secondary | ICD-10-CM | POA: Diagnosis not present

## 2020-02-27 DIAGNOSIS — H90A32 Mixed conductive and sensorineural hearing loss, unilateral, left ear with restricted hearing on the contralateral side: Secondary | ICD-10-CM | POA: Diagnosis not present

## 2020-03-13 DIAGNOSIS — Z6820 Body mass index (BMI) 20.0-20.9, adult: Secondary | ICD-10-CM | POA: Diagnosis not present

## 2020-03-13 DIAGNOSIS — Z01419 Encounter for gynecological examination (general) (routine) without abnormal findings: Secondary | ICD-10-CM | POA: Diagnosis not present

## 2020-03-17 DIAGNOSIS — H6122 Impacted cerumen, left ear: Secondary | ICD-10-CM | POA: Diagnosis not present

## 2020-04-16 ENCOUNTER — Encounter: Payer: Self-pay | Admitting: Cardiovascular Disease

## 2020-04-16 NOTE — Progress Notes (Signed)
Cardiology Office Note:    Date:  04/17/2020   ID:  Diana Stephens, DOB 12-Feb-1946, MRN [id number]  PCP:  Lawerance Cruel, MD  [place] Cardiologist:  No primary care provider on file.  [facility] Electrophysiologist:  None   Referring MD: Lawerance Cruel, MD   Chief Complaint  Patient presents with  . Hypertension     Oct. 4, 2021   Diana Stephens is a 75 y.o. female with a hx of intermittent chest pain . We are asked to see her for further evaluation of her chest pain by Dr. Harrington Challenger.  She has been under lots of stress taking care of husband , Ron ,- who has progressive dementia . She recently saw Dr. Harrington Challenger.  BP was elevated at the time. Has had some shooting chest pain in the remote past .  Only lasts for a second or so  Her kids are telling her that she needs to move into a smaller house.   She used to go to the gym 5 days a week but has not been going out much .   Has to stay with her husband.  Encouraged her to get out and exercise more   Jan. 6, 2022: Diana Stephens is seen today for follow up of her HTN Lots of stress over Christmas holidays  Has been taking her BP  Ron is getting more demented. Gave her the name of a sitter, Roseanne Kaufman as a Tree surgeon .    Past Medical History:  Diagnosis Date  . Cataract    small bilaterally  . Osteoporosis     Past Surgical History:  Procedure Laterality Date  . ACOUSTIC NEUROMA RESECTION  2008  . COLONOSCOPY  04-29-2003   tics  . TUBAL LIGATION  1976  . VAGINAL DELIVERY     x2- 72,76    Current Medications: Current Meds  Medication Sig  . aspirin 81 MG chewable tablet Chew 81 mg by mouth daily.  . Calcium Citrate (CITRACAL PO) Take 1 capsule by mouth daily.  . cholecalciferol (VITAMIN D) 1000 units tablet Take 1,000 Units by mouth daily.  . Cyanocobalamin (VITAMIN B 12 PO) Take 1 capsule by mouth daily.  . Multiple Vitamins-Minerals (CENTRUM SILVER ADULT 50+ PO) Take 1 capsule by mouth daily.  .  Omega-3 Fatty Acids (FISH OIL PO) Take 1 capsule by mouth daily.  Marland Kitchen pyridoxine (B-6) 100 MG tablet Take 1 tablet by mouth daily.     Allergies:   Patient has no known allergies.   Social History   Socioeconomic History  . Marital status: Married    Spouse name: Not on file  . Number of children: Not on file  . Years of education: Not on file  . Highest education level: Not on file  Occupational History  . Not on file  Tobacco Use  . Smoking status: Former Research scientist (life sciences)  . Smokeless tobacco: Never Used  Substance and Sexual Activity  . Alcohol use: Yes    Alcohol/week: 2.0 standard drinks    Types: 2 Standard drinks or equivalent per week    Comment:  1-2 glass of wine a week   . Drug use: No  . Sexual activity: Not on file  Other Topics Concern  . Not on file  Social History Narrative  . Not on file   Social Determinants of Health   Financial Resource Strain: Not on file  Food Insecurity: Not on file  Transportation Needs: Not on file  Physical Activity: Not  on file  Stress: Not on file  Social Connections: Not on file     Family History: The patient's family history includes Alzheimer's disease in her mother; Diabetes in her father and mother; Heart disease in her father. There is no history of Colon cancer or Colon polyps.  ROS:   Please see the history of present illness.     All other systems reviewed and are negative.  EKGs/Labs/Other Studies Reviewed:    The following studies were reviewed today: Labs from Dr. Tenny Craw Potassium and other labs are  normal     Recent Labs: No results found for requested labs within last 8760 hours.  Recent Lipid Panel No results found for: CHOL, TRIG, HDL, CHOLHDL, VLDL, LDLCALC, LDLDIRECT  Physical Exam:    Physical Exam: Blood pressure 134/74, pulse 64, height 5\' 4"  (1.626 m), weight 117 lb (53.1 kg), SpO2 97 %.  GEN:  Well nourished, well developed in no acute distress HEENT: Normal NECK: No JVD; No carotid  bruits LYMPHATICS: No lymphadenopathy CARDIAC: RRR , no murmurs, rubs, gallops RESPIRATORY:  Clear to auscultation without rales, wheezing or rhonchi  ABDOMEN: Soft, non-tender, non-distended MUSCULOSKELETAL:  No edema; No deformity  SKIN: Warm and dry NEUROLOGIC:  Alert and oriented x 3   EKG:     ASSESSMENT:    1. Essential hypertension   2. PVC (premature ventricular contraction)    PLAN:    1. Mildly hypertension:  BP is well controlled.  2.   2.  Premature ventricular contractions.   Stable .    Medication Adjustments/Labs and Tests Ordered: Current medicines are reviewed at length with the patient today.  Concerns regarding medicines are outlined above.  No orders of the defined types were placed in this encounter.  No orders of the defined types were placed in this encounter.   Patient Instructions  Elderly sitter [phone]  Medication Instructions:  Your physician recommends that you continue on your current medications as directed. Please refer to the Current Medication list given to you today.  *If you need a refill on your cardiac medications before your next appointment, please call your pharmacy*   Lab Work: None Ordered If you have labs (blood work) drawn today and your tests are completely normal, you will receive your results only by: [phone] MyChart Message (if you have MyChart) OR . A paper copy in the mail If you have any lab test that is abnormal or we need to change your treatment, we will call you to review the results.   Testing/Procedures: None Ordered    Follow-Up: At [facility], you and your health needs are our priority.  As part of our continuing mission to provide you with exceptional heart care, we have created designated Provider Care Teams.  These Care Teams include your primary Cardiologist (physician) and Advanced Practice Providers (APPs -  Physician Assistants and Nurse Practitioners) who all work together to  provide you with the care you need, when you need it.   Your next appointment:   1 year(s)  The format for your next appointment:   In Person  Provider:   You may see [facility], MD or one of the following Advanced Practice Providers on your designated Care Team:    Kristeen Miss, PA-C  Tereso Newcomer, Chelsea Aus        Signed, [place], MD  04/17/2020 10:44 AM    [facility]

## 2020-04-17 ENCOUNTER — Ambulatory Visit (INDEPENDENT_AMBULATORY_CARE_PROVIDER_SITE_OTHER): Payer: Medicare HMO | Admitting: Cardiovascular Disease

## 2020-04-17 ENCOUNTER — Encounter: Payer: Self-pay | Admitting: Cardiovascular Disease

## 2020-04-17 ENCOUNTER — Other Ambulatory Visit: Payer: Self-pay

## 2020-04-17 VITALS — BP 134/74 | HR 64 | Ht 64.0 in | Wt 117.0 lb

## 2020-04-17 DIAGNOSIS — I493 Ventricular premature depolarization: Secondary | ICD-10-CM

## 2020-04-17 DIAGNOSIS — I10 Essential (primary) hypertension: Secondary | ICD-10-CM | POA: Diagnosis not present

## 2020-04-17 NOTE — Patient Instructions (Addendum)
Elderly sitter Marcelino Duster [phone]  Medication Instructions:  Your physician recommends that you continue on your current medications as directed. Please refer to the Current Medication list given to you today.  *If you need a refill on your cardiac medications before your next appointment, please call your pharmacy*   Lab Work: None Ordered If you have labs (blood work) drawn today and your tests are completely normal, you will receive your results only by: Marland Kitchen MyChart Message (if you have MyChart) OR . A paper copy in the mail If you have any lab test that is abnormal or we need to change your treatment, we will call you to review the results.   Testing/Procedures: None Ordered    Follow-Up: At [facility], you and your health needs are our priority.  As part of our continuing mission to provide you with exceptional heart care, we have created designated Provider Care Teams.  These Care Teams include your primary Cardiologist (physician) and Advanced Practice Providers (APPs -  Physician Assistants and Nurse Practitioners) who all work together to provide you with the care you need, when you need it.   Your next appointment:   1 year(s)  The format for your next appointment:   In Person  Provider:   You may see Kristeen Miss, MD or one of the following Advanced Practice Providers on your designated Care Team:    Tereso Newcomer, PA-C  [place], [place]

## 2020-08-20 DIAGNOSIS — Z0000 Encounter for general adult medical examination without abnormal findings: Secondary | ICD-10-CM | POA: Diagnosis not present

## 2020-08-20 DIAGNOSIS — R292 Abnormal reflex: Secondary | ICD-10-CM | POA: Diagnosis not present

## 2020-08-20 DIAGNOSIS — E7800 Pure hypercholesterolemia, unspecified: Secondary | ICD-10-CM | POA: Diagnosis not present

## 2020-08-20 DIAGNOSIS — M8580 Other specified disorders of bone density and structure, unspecified site: Secondary | ICD-10-CM | POA: Diagnosis not present

## 2020-08-20 DIAGNOSIS — R944 Abnormal results of kidney function studies: Secondary | ICD-10-CM | POA: Diagnosis not present

## 2020-08-26 DIAGNOSIS — N390 Urinary tract infection, site not specified: Secondary | ICD-10-CM | POA: Diagnosis not present

## 2020-10-02 DIAGNOSIS — Z1231 Encounter for screening mammogram for malignant neoplasm of breast: Secondary | ICD-10-CM | POA: Diagnosis not present

## 2020-10-02 DIAGNOSIS — N958 Other specified menopausal and perimenopausal disorders: Secondary | ICD-10-CM | POA: Diagnosis not present

## 2020-10-02 DIAGNOSIS — M816 Localized osteoporosis [Lequesne]: Secondary | ICD-10-CM | POA: Diagnosis not present

## 2020-10-22 DIAGNOSIS — M810 Age-related osteoporosis without current pathological fracture: Secondary | ICD-10-CM | POA: Diagnosis not present

## 2020-10-24 ENCOUNTER — Encounter: Payer: Self-pay | Admitting: Gastroenterology

## 2020-12-10 DIAGNOSIS — L821 Other seborrheic keratosis: Secondary | ICD-10-CM | POA: Diagnosis not present

## 2020-12-10 DIAGNOSIS — L853 Xerosis cutis: Secondary | ICD-10-CM | POA: Diagnosis not present

## 2020-12-10 DIAGNOSIS — L814 Other melanin hyperpigmentation: Secondary | ICD-10-CM | POA: Diagnosis not present

## 2020-12-10 DIAGNOSIS — L820 Inflamed seborrheic keratosis: Secondary | ICD-10-CM | POA: Diagnosis not present

## 2020-12-10 DIAGNOSIS — D225 Melanocytic nevi of trunk: Secondary | ICD-10-CM | POA: Diagnosis not present

## 2021-02-09 DIAGNOSIS — Z961 Presence of intraocular lens: Secondary | ICD-10-CM | POA: Diagnosis not present

## 2021-02-09 DIAGNOSIS — H5213 Myopia, bilateral: Secondary | ICD-10-CM | POA: Diagnosis not present

## 2021-03-30 DIAGNOSIS — Z6820 Body mass index (BMI) 20.0-20.9, adult: Secondary | ICD-10-CM | POA: Diagnosis not present

## 2021-03-30 DIAGNOSIS — Z01419 Encounter for gynecological examination (general) (routine) without abnormal findings: Secondary | ICD-10-CM | POA: Diagnosis not present

## 2021-05-21 DIAGNOSIS — N644 Mastodynia: Secondary | ICD-10-CM | POA: Diagnosis not present

## 2021-07-12 ENCOUNTER — Encounter: Payer: Self-pay | Admitting: Cardiovascular Disease

## 2021-07-12 NOTE — Progress Notes (Signed)
?Cardiology Office Note:   ? ?Date:  07/13/2021  ? ?ID:  Diana Stephens, DOB [birth year], MRN [id number] ? ?PCP:  Lawerance Cruel, MD  ?[place] Cardiologist:  Mertie Moores, MD  ?[facility] Electrophysiologist:  None  ? ?Referring MD: Lawerance Cruel, MD  ? ?Chief Complaint  ?Patient presents with  ? Hypertension  ?   ? ?  ?  ? ?Oct. 4, 2021  ? ?Diana Stephens is a 76 y.o. female with a hx of intermittent chest pain . ?We are asked to see her for further evaluation of her chest pain by Dr. Harrington Challenger. ? ?She has been under lots of stress taking care of husband , Diana Stephens ,- who has progressive dementia . ?She recently saw Dr. Harrington Challenger.  BP was elevated at the time. ?Has had some shooting chest pain in the remote past .  ?Only lasts for a second or so  ?Her kids are telling her that she needs to move into a smaller house.  ? ?She used to go to the gym 5 days a week but has not been going out much .   Has to stay with her husband.  ?Encouraged her to get out and exercise more  ? ?Jan. 6, 2022: ?Diana Stephens is seen today for follow up of her HTN ?Lots of stress over Christmas holidays  ?Has been taking her BP ? ?Diana Stephens is getting more demented. ?Gave her the name of a sitter, Roseanne Kaufman as a possible sitter . ? ?July 13, 2021 ?Diana Stephens is seen today for follow up of her HTN ?  ?Is concerned about some left breast pain , pain seemed to move around in her breast  ?Recent mamogram looked ok  ?Exam was ok  ? ?Has not had any chest tightness.  ?Has been under stress - had major water damage from a malfunctioning washing machine.  ? ?Her BP has been well controlled ? Is not going to the gym anymore - is too busy taking care of the house and Diana Stephens ( severe alzheimer's now) ? ?Has had some falls over the past several years. ?Has had some nerve injury in her leg ? ?Past Medical History:  ?Diagnosis Date  ? Cataract   ? small bilaterally  ? Osteoporosis   ? ? ?Past Surgical History:  ?Procedure Laterality Date  ? ACOUSTIC NEUROMA  RESECTION  2008  ? COLONOSCOPY  04-29-2003  ? tics  ? TUBAL LIGATION  1976  ? VAGINAL DELIVERY    ? x2- 72,76  ? ? ?Current Medications: ?Current Meds  ?Medication Sig  ? Calcium Citrate (CITRACAL PO) Take 1 capsule by mouth daily.  ? cholecalciferol (VITAMIN D) 1000 units tablet Take 1,000 Units by mouth daily.  ? Cyanocobalamin (VITAMIN B 12 PO) Take 1 capsule by mouth daily.  ? Multiple Vitamins-Minerals (CENTRUM SILVER ADULT 50+ PO) Take 1 capsule by mouth daily.  ? pyridoxine (B-6) 100 MG tablet Take 1 tablet by mouth daily.  ?  ? ?Allergies:   Patient has no known allergies.  ? ?Social History  ? ?Socioeconomic History  ? Marital status: Married  ?  Spouse name: Not on file  ? Number of children: Not on file  ? Years of education: Not on file  ? Highest education level: Not on file  ?Occupational History  ? Not on file  ?Tobacco Use  ? Smoking status: Former  ? Smokeless tobacco: Never  ?Substance and Sexual Activity  ? Alcohol use: Yes  ?  Alcohol/week: 2.0  standard drinks  ?  Types: 2 Standard drinks or equivalent per week  ?  Comment:  1-2 glass of wine a week   ? Drug use: No  ? Sexual activity: Not on file  ?Other Topics Concern  ? Not on file  ?Social History Narrative  ? Not on file  ? ?Social Determinants of Health  ? ?Financial Resource Strain: Not on file  ?Food Insecurity: Not on file  ?Transportation Needs: Not on file  ?Physical Activity: Not on file  ?Stress: Not on file  ?Social Connections: Not on file  ?  ? ?Family History: ?The patient's family history includes Alzheimer's disease in her mother; Diabetes in her father and mother; Heart disease in her father. There is no history of Colon cancer or Colon polyps. ? ?ROS:   ?Please see the history of present illness.    ? All other systems reviewed and are negative. ? ?EKGs/Labs/Other Studies Reviewed:   ? ?The following studies were reviewed today: ?Labs from Dr. Harrington Challenger ?Potassium and other labs are  normal  ? ? ? ?Recent Labs: ?No results found  for requested labs within last 8760 hours.  ?Recent Lipid Panel ?No results found for: CHOL, TRIG, HDL, CHOLHDL, VLDL, LDLCALC, LDLDIRECT ? ?Physical Exam:   ? ? ?Physical Exam: ?Blood pressure 140/78, pulse 64, height '5\' 4"'$  (1.626 m), weight 116 lb 3.2 oz (52.7 kg), SpO2 99 %. ? ?GEN:  Well nourished, well developed in no acute distress ?HEENT: Normal ?NECK: No JVD; No carotid bruits ?LYMPHATICS: No lymphadenopathy ?CARDIAC: RRR , rare premature beat.    ?Breast: no massess, no tenderness.   Has + costochondritis above L breast  ? ?RESPIRATORY:  Clear to auscultation without rales, wheezing or rhonchi  ?ABDOMEN: Soft, non-tender, non-distended ?MUSCULOSKELETAL:  No edema; No deformity  ?SKIN: Warm and dry ?NEUROLOGIC:  Alert and oriented x 3 ? ? ?  ?EKG:    July 13, 2021:   NSR at 87  ? ? ?ASSESSMENT:   ? ?No diagnosis found. ? ?PLAN:   ? ?Mildly hypertension: Blood pressure is typically better if she does some deep breathing exercises. ? ? ?2.  Premature ventricular contractions.   ? ?3.  Chest pain: On exam she has symptoms that are consistent with costochondritis.  She is clearly tender along 1 rib along the upper aspect of her left breast.  I have asked her to take Motrin 400 mg 3 times a day for for 5 days.  She will call us back if her pains worsen. ? ?Medication Adjustments/Labs and Tests Ordered: ?Current medicines are reviewed at length with the patient today.  Concerns regarding medicines are outlined above.  ?No orders of the defined types were placed in this encounter. ? ?No orders of the defined types were placed in this encounter. ? ? ?There are no Patient Instructions on file for this visit.  ? ?Signed, ?Mertie Moores, MD  ?07/13/2021 4:29 PM    ?[place] ? ?

## 2021-07-13 ENCOUNTER — Encounter: Payer: Self-pay | Admitting: Cardiovascular Disease

## 2021-07-13 ENCOUNTER — Ambulatory Visit: Payer: Medicare HMO | Admitting: Cardiovascular Disease

## 2021-07-13 VITALS — BP 140/78 | HR 64 | Ht 64.0 in | Wt 116.2 lb

## 2021-07-13 DIAGNOSIS — M940 Chondrocostal junction syndrome [Tietze]: Secondary | ICD-10-CM | POA: Diagnosis not present

## 2021-07-13 DIAGNOSIS — R079 Chest pain, unspecified: Secondary | ICD-10-CM

## 2021-07-13 DIAGNOSIS — I10 Essential (primary) hypertension: Secondary | ICD-10-CM | POA: Diagnosis not present

## 2021-07-13 NOTE — Patient Instructions (Signed)
Medication Instructions:  ?Your physician recommends that you continue on your current medications as directed. Please refer to the Current Medication list given to you today. ? ?*If you need a refill on your cardiac medications before your next appointment, please call your pharmacy* ? ?Lab Work: ?NONE ?If you have labs (blood work) drawn today and your tests are completely normal, you will receive your results only by: ?MyChart Message (if you have MyChart) OR ?A paper copy in the mail ?If you have any lab test that is abnormal or we need to change your treatment, we will call you to review the results. ? ?Testing/Procedures: ?NONE ? ?Follow-Up: ?At [facility], you and your health needs are our priority.  As part of our continuing mission to provide you with exceptional heart care, we have created designated Provider Care Teams.  These Care Teams include your primary Cardiologist (physician) and Advanced Practice Providers (APPs -  Physician Assistants and Nurse Practitioners) who all work together to provide you with the care you need, when you need it. ? ?We recommend signing up for the patient portal called "MyChart".  Sign up information is provided on this After Visit Summary.  MyChart is used to connect with patients for Virtual Visits (Telemedicine).  Patients are able to view lab/test results, encounter notes, upcoming appointments, etc.  Non-urgent messages can be sent to your provider as well.   ?To learn more about what you can do with MyChart, go to [email or web address].   ? ?Your next appointment:   ?1 year(s) ? ?The format for your next appointment:   ?In Person ? ?Provider:   ?Philip Nahser, MD  or Vin Bhagat, PA-C or Scott Weaver, PA-C ?

## 2021-08-24 DIAGNOSIS — E7800 Pure hypercholesterolemia, unspecified: Secondary | ICD-10-CM | POA: Diagnosis not present

## 2021-08-24 DIAGNOSIS — R944 Abnormal results of kidney function studies: Secondary | ICD-10-CM | POA: Diagnosis not present

## 2021-08-28 DIAGNOSIS — Z0000 Encounter for general adult medical examination without abnormal findings: Secondary | ICD-10-CM | POA: Diagnosis not present

## 2021-08-28 DIAGNOSIS — E7800 Pure hypercholesterolemia, unspecified: Secondary | ICD-10-CM | POA: Diagnosis not present

## 2021-08-28 DIAGNOSIS — Z1159 Encounter for screening for other viral diseases: Secondary | ICD-10-CM | POA: Diagnosis not present

## 2021-08-28 DIAGNOSIS — R944 Abnormal results of kidney function studies: Secondary | ICD-10-CM | POA: Diagnosis not present

## 2021-10-05 DIAGNOSIS — Z1231 Encounter for screening mammogram for malignant neoplasm of breast: Secondary | ICD-10-CM | POA: Diagnosis not present

## 2021-10-20 DIAGNOSIS — T1511XA Foreign body in conjunctival sac, right eye, initial encounter: Secondary | ICD-10-CM | POA: Diagnosis not present

## 2021-11-30 DIAGNOSIS — H02055 Trichiasis without entropian left lower eyelid: Secondary | ICD-10-CM | POA: Diagnosis not present

## 2021-11-30 DIAGNOSIS — H26493 Other secondary cataract, bilateral: Secondary | ICD-10-CM | POA: Diagnosis not present

## 2021-12-23 DIAGNOSIS — H26492 Other secondary cataract, left eye: Secondary | ICD-10-CM | POA: Diagnosis not present

## 2021-12-30 DIAGNOSIS — H26491 Other secondary cataract, right eye: Secondary | ICD-10-CM | POA: Diagnosis not present

## 2021-12-31 DIAGNOSIS — L723 Sebaceous cyst: Secondary | ICD-10-CM | POA: Diagnosis not present

## 2022-01-19 DIAGNOSIS — L821 Other seborrheic keratosis: Secondary | ICD-10-CM | POA: Diagnosis not present

## 2022-01-19 DIAGNOSIS — L814 Other melanin hyperpigmentation: Secondary | ICD-10-CM | POA: Diagnosis not present

## 2022-01-19 DIAGNOSIS — D692 Other nonthrombocytopenic purpura: Secondary | ICD-10-CM | POA: Diagnosis not present

## 2022-01-19 DIAGNOSIS — D225 Melanocytic nevi of trunk: Secondary | ICD-10-CM | POA: Diagnosis not present

## 2022-03-31 DIAGNOSIS — Z6820 Body mass index (BMI) 20.0-20.9, adult: Secondary | ICD-10-CM | POA: Diagnosis not present

## 2022-03-31 DIAGNOSIS — M810 Age-related osteoporosis without current pathological fracture: Secondary | ICD-10-CM | POA: Diagnosis not present

## 2022-03-31 DIAGNOSIS — Z01419 Encounter for gynecological examination (general) (routine) without abnormal findings: Secondary | ICD-10-CM | POA: Diagnosis not present

## 2022-04-07 ENCOUNTER — Other Ambulatory Visit: Payer: Self-pay

## 2022-04-07 DIAGNOSIS — M810 Age-related osteoporosis without current pathological fracture: Secondary | ICD-10-CM | POA: Insufficient documentation

## 2022-04-13 ENCOUNTER — Telehealth: Payer: Self-pay | Admitting: Pharmacy Technician

## 2022-04-13 NOTE — Telephone Encounter (Addendum)
Auth Submission: no auth needed Payer: [employer] Medication & CPT/J Code(s) submitted: reclast  Route of submission (phone, fax, portal):  Phone # [phone] Fax # [phone] Auth type: Buy/Bill Units/visits requested: X1 Reference number:  Approval from: 04/19/22 to 04/12/23  at [place]

## 2022-04-15 ENCOUNTER — Other Ambulatory Visit: Payer: Self-pay

## 2022-04-19 ENCOUNTER — Encounter: Payer: Self-pay | Admitting: Obstetrics and Gynecology

## 2022-05-03 ENCOUNTER — Ambulatory Visit (INDEPENDENT_AMBULATORY_CARE_PROVIDER_SITE_OTHER): Payer: Medicare HMO

## 2022-05-03 VITALS — BP 132/79 | HR 60 | Temp 97.8°F | Resp 18 | Ht 63.0 in | Wt 118.2 lb

## 2022-05-03 DIAGNOSIS — M810 Age-related osteoporosis without current pathological fracture: Secondary | ICD-10-CM | POA: Diagnosis not present

## 2022-05-03 MED ORDER — ZOLEDRONIC ACID 5 MG/100ML IV SOLN
5.0000 mg | Freq: Once | INTRAVENOUS | Status: AC
  Administered 2022-05-03: 5 mg via INTRAVENOUS
  Filled 2022-05-03: qty 100

## 2022-05-03 MED ORDER — ACETAMINOPHEN 325 MG PO TABS
650.0000 mg | ORAL_TABLET | Freq: Once | ORAL | Status: AC
  Administered 2022-05-03: 650 mg via ORAL
  Filled 2022-05-03: qty 2

## 2022-05-03 MED ORDER — DIPHENHYDRAMINE HCL 25 MG PO CAPS
25.0000 mg | ORAL_CAPSULE | Freq: Once | ORAL | Status: AC
  Administered 2022-05-03: 25 mg via ORAL
  Filled 2022-05-03: qty 1

## 2022-05-03 MED ORDER — SODIUM CHLORIDE 0.9 % IV SOLN: INTRAVENOUS | Status: DC

## 2022-05-03 NOTE — Patient Instructions (Signed)

## 2022-05-03 NOTE — Progress Notes (Signed)
Diagnosis: Osteoporosis  Provider:  Marshell Garfinkel MD  Procedure: Infusion  IV Type: Peripheral, IV Location: R Forearm  Reclast (Zolendronic Acid), Dose: 5 mg  Infusion Start Time: 2336  Infusion Stop Time: 1209  Post Infusion IV Care: Observation period completed and Peripheral IV Discontinued  Discharge: Condition: Good, Destination: Home . AVS provided to patient.   Performed by:  Adelina Mings, LPN

## 2022-05-14 ENCOUNTER — Encounter: Payer: Self-pay | Admitting: Gastroenterology

## 2022-06-16 ENCOUNTER — Encounter: Payer: Self-pay | Admitting: Gastroenterology

## 2022-06-16 ENCOUNTER — Ambulatory Visit: Payer: Medicare HMO | Admitting: Gastroenterology

## 2022-06-16 VITALS — BP 118/68 | HR 76 | Ht 64.0 in | Wt 118.0 lb

## 2022-06-16 DIAGNOSIS — Z86010 Personal history of colonic polyps: Secondary | ICD-10-CM | POA: Diagnosis not present

## 2022-06-16 MED ORDER — NA SULFATE-K SULFATE-MG SULF 17.5-3.13-1.6 GM/177ML PO SOLN: 1.0000 | Freq: Once | ORAL | 0 refills | Status: AC

## 2022-06-16 NOTE — Patient Instructions (Addendum)
  You have been scheduled for a colonoscopy. Please follow written instructions given to you at your visit today.  Please pick up your prep supplies at the pharmacy within the next 1-3 days. If you use inhalers (even only as needed), please bring them with you on the day of your procedure.  _______________________________________________________  If your blood pressure at your visit was 140/90 or greater, please contact your primary care physician to follow up on this.  _______________________________________________________  If you are age 67 or older, your body mass index should be between 23-30. Your Body mass index is 20.25 kg/m. If this is out of the aforementioned range listed, please consider follow up with your Primary Care Provider.  If you are age 49 or younger, your body mass index should be between 19-25. Your Body mass index is 20.25 kg/m. If this is out of the aformentioned range listed, please consider follow up with your Primary Care Provider.   ________________________________________________________  The [facility] providers would like to encourage you to use [facility] to communicate with providers for non-urgent requests or questions.  Due to long hold times on the telephone, sending your provider a message by [facility] may be a faster and more efficient way to get a response.  Please allow 48 business hours for a response.  Please remember that this is for non-urgent requests.  _______________________________________________________ It was a pleasure to see you today!  Thank you for trusting me with your gastrointestinal care!

## 2022-06-16 NOTE — Progress Notes (Signed)
[facility] Consult Note:  History: Diana Stephens 06/16/2022  Referring provider: Lawerance Cruel, MD  Reason for consult/chief complaint: Colonoscopy (Pt  states she is here to discuss having a colonoscopy )   Subjective  HPI: Diana Stephens was here to see me today for history of colon polyp and consideration of surveillance colonoscopy. Colonoscopy with me February 2017, complete exam with good prep.  Diminutive cecal tubular adenoma removed with cold biopsy forceps.          No polyps on 2005 colonoscopy. _________________   Diana Stephens is feeling well these days with no complaints of abdominal pain, nausea, vomiting, altered bowel habits, rectal bleeding or weight loss.  She denies chest pain or dyspnea and feels that her health is very good overall.  Since I last saw her, her husband has developed dementia and she is his primary caregiver.    Past Medical History: Past Medical History:  Diagnosis Date   Arthritis    Cataract    small bilaterally   Colon polyps    Osteoporosis      Past Surgical History: Past Surgical History:  Procedure Laterality Date   ACOUSTIC NEUROMA RESECTION  2008   COLONOSCOPY  04-29-2003   tics   TUBAL LIGATION  1976   VAGINAL DELIVERY     x2- 72,76     Family History: Family History  Problem Relation Age of Onset   Diabetes Mother    Alzheimer's disease Mother    Diabetes Father    Heart disease Father    Colon cancer Neg Hx    Colon polyps Neg Hx    Esophageal cancer Neg Hx     Social History: Social History   Socioeconomic History   Marital status: Married    Spouse name: Not on file   Number of children: 2   Years of education: Not on file   Highest education level: Not on file  Occupational History   Occupation: retired  Tobacco Use   Smoking status: Former   Smokeless tobacco: Never  Scientific laboratory technician Use: Never used  Substance and Sexual Activity   Alcohol use: Yes    Alcohol/week: 2.0  standard drinks of alcohol    Types: 2 Standard drinks or equivalent per week    Comment:  1-2 glass of wine a week    Drug use: No   Sexual activity: Not on file  Other Topics Concern   Not on file  Social History Narrative   Not on file   Social Determinants of Health   Financial Resource Strain: Not on file  Food Insecurity: Not on file  Transportation Needs: Not on file  Physical Activity: Not on file  Stress: Not on file  Social Connections: Not on file    Allergies: No Known Allergies  Outpatient Meds: Current Outpatient Medications  Medication Sig Dispense Refill   aspirin 81 MG chewable tablet Chew 81 mg by mouth daily.     Calcium Citrate (CITRACAL PO) Take 1 capsule by mouth daily.     cholecalciferol (VITAMIN D) 1000 units tablet Take 1,000 Units by mouth daily.     Cyanocobalamin (VITAMIN B 12 PO) Take 1 capsule by mouth daily.     Multiple Vitamins-Minerals (CENTRUM SILVER ADULT 50+ PO) Take 1 capsule by mouth daily.     Na Sulfate-K Sulfate-Mg Sulf 17.5-3.13-1.6 GM/177ML SOLN Take 1 kit by mouth once for 1 dose. 354 mL 0   pyridoxine (B-6) 100 MG tablet Take  1 tablet by mouth daily.     Omega-3 Fatty Acids (FISH OIL PO) Take 1 capsule by mouth daily. (Patient not taking: Reported on 07/13/2021)     No current facility-administered medications for this visit.      ___________________________________________________________________ Objective   Exam:  BP 118/68   Pulse 76   Ht '5\' 4"'$  (1.626 m)   Wt 118 lb (53.5 kg)   BMI 20.25 kg/m  Wt Readings from Last 3 Encounters:  06/16/22 118 lb (53.5 kg)  05/03/22 118 lb 3.2 oz (53.6 kg)  07/13/21 116 lb 3.2 oz (52.7 kg)    General: Thin, well-appearing, very pleasant Eyes: sclera anicteric, no redness ENT: oral mucosa moist without lesions, no cervical or supraclavicular lymphadenopathy CV: Regular without appreciable murmur, no JVD, no peripheral edema Resp: clear to auscultation bilaterally, normal RR and  effort noted GI: soft, no tenderness, with active bowel sounds. No guarding or palpable organomegaly noted.    Assessment: Encounter Diagnosis  Name Primary?   Personal history of colonic polyps Yes    Due for surveillance colonoscopy based on current guidelines.  She is both healthy and willing to undergo the procedure.  Procedure described along with risks and benefits for review.  The benefits and risks of the planned procedure were described in detail with the patient or (when appropriate) their health care proxy.  Risks were outlined as including, but not limited to, bleeding, infection, perforation, adverse medication reaction leading to cardiac or pulmonary decompensation, pancreatitis (if ERCP).  The limitation of incomplete mucosal visualization was also discussed.  No guarantees or warranties were given.    Thank you for the courtesy of this consult.  Please call me with any questions or concerns.  Nelida Meuse III  CC: Referring provider noted above

## 2022-07-06 ENCOUNTER — Encounter: Payer: Self-pay | Admitting: Gastroenterology

## 2022-07-15 ENCOUNTER — Ambulatory Visit (AMBULATORY_SURGERY_CENTER): Payer: Medicare HMO | Admitting: Gastroenterology

## 2022-07-15 ENCOUNTER — Encounter: Payer: Self-pay | Admitting: Gastroenterology

## 2022-07-15 VITALS — BP 99/75 | HR 64 | Temp 95.7°F | Resp 13 | Ht 64.0 in | Wt 118.0 lb

## 2022-07-15 DIAGNOSIS — Z86010 Personal history of colonic polyps: Secondary | ICD-10-CM | POA: Diagnosis not present

## 2022-07-15 DIAGNOSIS — Z09 Encounter for follow-up examination after completed treatment for conditions other than malignant neoplasm: Secondary | ICD-10-CM

## 2022-07-15 DIAGNOSIS — D128 Benign neoplasm of rectum: Secondary | ICD-10-CM

## 2022-07-15 MED ORDER — SODIUM CHLORIDE 0.9 % IV SOLN: 500.0000 mL | Freq: Once | INTRAVENOUS | Status: DC

## 2022-07-15 NOTE — Progress Notes (Signed)
Pt's states no medical or surgical changes since previsit or office visit. 

## 2022-07-15 NOTE — Op Note (Signed)
[facility] Patient Name: Diana Stephens Procedure Date: 07/15/2022 2:15 PM MRN: [id number] Endoscopist: Mallie Mussel L. Loletha Carrow , MD, [id number] Age: 77 Referring MD:  Date of Birth: 06/25/1945 Gender: Female Account #: [id number] Procedure:                Colonoscopy Indications:              Surveillance: Personal history of adenomatous                            polyps on last colonoscopy > 5 years ago                           Diminutive cecal adenomatous polyp 2017                           No polyps 2005 Medicines:                Monitored Anesthesia Care Procedure:                Pre-Anesthesia Assessment:                           - Prior to the procedure, a History and Physical                            was performed, and patient medications and                            allergies were reviewed. The patient's tolerance of                            previous anesthesia was also reviewed. The risks                            and benefits of the procedure and the sedation                            options and risks were discussed with the patient.                            All questions were answered, and informed consent                            was obtained. Prior Anticoagulants: The patient has                            taken no anticoagulant or antiplatelet agents. ASA                            Grade Assessment: II - A patient with mild systemic                            disease. After reviewing the risks and benefits,  the patient was deemed in satisfactory condition to                            undergo the procedure.                           After obtaining informed consent, the colonoscope                            was passed under direct vision. Throughout the                            procedure, the patient's blood pressure, pulse, and                            oxygen saturations were monitored continuously. The                             olympus scope [id number] was introduced through the                            anus and advanced to the the cecum, identified by                            appendiceal orifice and ileocecal valve. The                            colonoscopy was somewhat difficult due to multiple                            diverticula in the colon, a redundant colon,                            significant looping and a tortuous colon.                            Successful completion of the procedure was aided by                            using manual pressure and straightening and                            shortening the scope to obtain bowel loop                            reduction. The patient tolerated the procedure                            well. The quality of the bowel preparation was                            good. The ileocecal valve, appendiceal orifice, and  rectum were photographed. Scope In: 2:29:06 PM Scope Out: 2:46:28 PM Scope Withdrawal Time: 0 hours 10 minutes 51 seconds  Total Procedure Duration: 0 hours 17 minutes 22 seconds  Findings:                 The digital rectal exam findings include decreased                            sphincter tone.                           Repeat examination of right colon under NBI                            performed.                           There was a large lipoma, at the hepatic flexure.                           Multiple diverticula were found in the left colon                            and right colon.                           A diminutive polyp was found in the distal rectum.                            The polyp was sessile. The polyp was removed with a                            cold snare. Resection and retrieval were complete.                           Retroflexion in the rectum was not performed due to                            anatomy.                           The exam was otherwise without  abnormality. Complications:            No immediate complications. Estimated Blood Loss:     Estimated blood loss was minimal. Impression:               - Decreased sphincter tone found on digital rectal                            exam.                           - Large lipoma at the hepatic flexure.                           - Diverticulosis in the left colon and in the right  colon.                           - One diminutive polyp in the distal rectum,                            removed with a cold snare. Resected and retrieved.                           - The examination was otherwise normal.                           - The GI Genius (intelligent endoscopy module),                            computer-aided polyp detection system powered by AI                            was utilized to detect colorectal polyps through                            enhanced visualization during colonoscopy. Recommendation:           - Patient has a contact number available for                            emergencies. The signs and symptoms of potential                            delayed complications were discussed with the                            patient. Return to normal activities tomorrow.                            Written discharge instructions were provided to the                            patient.                           - Resume previous diet.                           - Continue present medications.                           - Await pathology results.                           - No repeat surveillance colonoscopy recommended                            due to age, current guidelines and low risk polyp  history. Tasha Jindra L. Loletha Carrow, MD 07/15/2022 2:50:40 PM This report has been signed electronically.

## 2022-07-15 NOTE — Progress Notes (Signed)
Report to PACU, RN, vss, BBS= Clear.  

## 2022-07-15 NOTE — Progress Notes (Signed)
No changes to clinical history since GI office visit on 06/16/22.  The patient is appropriate for an endoscopic procedure in the ambulatory setting.  - Wilfrid Lund, MD

## 2022-07-15 NOTE — Patient Instructions (Signed)
Please read handouts provided. Continue present medications. Await pathology results.   YOU HAD AN ENDOSCOPIC PROCEDURE TODAY AT THE [facility]:   Refer to the procedure report that was given to you for any specific questions about what was found during the examination.  If the procedure report does not answer your questions, please call your gastroenterologist to clarify.  If you requested that your care partner not be given the details of your procedure findings, then the procedure report has been included in a sealed envelope for you to review at your convenience later.  YOU SHOULD EXPECT: Some feelings of bloating in the abdomen. Passage of more gas than usual.  Walking can help get rid of the air that was put into your GI tract during the procedure and reduce the bloating. If you had a lower endoscopy (such as a colonoscopy or flexible sigmoidoscopy) you may notice spotting of blood in your stool or on the toilet paper. If you underwent a bowel prep for your procedure, you may not have a normal bowel movement for a few days.  Please Note:  You might notice some irritation and congestion in your nose or some drainage.  This is from the oxygen used during your procedure.  There is no need for concern and it should clear up in a day or so.  SYMPTOMS TO REPORT IMMEDIATELY:  Following lower endoscopy (colonoscopy or flexible sigmoidoscopy):  Excessive amounts of blood in the stool  Significant tenderness or worsening of abdominal pains  Swelling of the abdomen that is new, acute  Fever of 100F or higher  For urgent or emergent issues, a gastroenterologist can be reached at any hour by calling [phone]. Do not use MyChart messaging for urgent concerns.    DIET:  We do recommend a small meal at first, but then you may proceed to your regular diet.  Drink plenty of fluids but you should avoid alcoholic beverages for 24 hours.  ACTIVITY:  You should plan to take it easy for  the rest of today and you should NOT DRIVE or use heavy machinery until tomorrow (because of the sedation medicines used during the test).    FOLLOW UP: Our staff will call the number listed on your records the next business day following your procedure.  We will call around 7:15- 8:00 am to check on you and address any questions or concerns that you may have regarding the information given to you following your procedure. If we do not reach you, we will leave a message.     If any biopsies were taken you will be contacted by phone or by letter within the next 1-3 weeks.  Please call us at [phone] if you have not heard about the biopsies in 3 weeks.    SIGNATURES/CONFIDENTIALITY: You and/or your care partner have signed paperwork which will be entered into your electronic medical record.  These signatures attest to the fact that that the information above on your After Visit Summary has been reviewed and is understood.  Full responsibility of the confidentiality of this discharge information lies with you and/or your care-partner. 

## 2022-07-15 NOTE — Progress Notes (Signed)
Called to room to assist during endoscopic procedure.  Patient ID and intended procedure confirmed with present staff. Received instructions for my participation in the procedure from the performing physician.  

## 2022-07-16 ENCOUNTER — Telehealth: Payer: Self-pay | Admitting: *Deleted

## 2022-07-16 NOTE — Telephone Encounter (Signed)
  Follow up Call-     07/15/2022    1:33 PM  Call back number  Post procedure Call Back phone  # [phone]  Permission to leave phone message Yes     Patient questions:  Do you have a fever, pain , or abdominal swelling? No. Pain Score  0 *  Have you tolerated food without any problems? Yes.    Have you been able to return to your normal activities? Yes.    Do you have any questions about your discharge instructions: Diet   No. Medications  No. Follow up visit  No.  Do you have questions or concerns about your Care? No.  Actions: * If pain score is 4 or above: No action needed, pain <4.

## 2022-07-20 ENCOUNTER — Encounter: Payer: Self-pay | Admitting: Gastroenterology

## 2022-08-25 DIAGNOSIS — E7800 Pure hypercholesterolemia, unspecified: Secondary | ICD-10-CM | POA: Diagnosis not present

## 2022-08-25 DIAGNOSIS — R944 Abnormal results of kidney function studies: Secondary | ICD-10-CM | POA: Diagnosis not present

## 2022-08-25 DIAGNOSIS — Z1159 Encounter for screening for other viral diseases: Secondary | ICD-10-CM | POA: Diagnosis not present

## 2022-09-01 DIAGNOSIS — R944 Abnormal results of kidney function studies: Secondary | ICD-10-CM | POA: Diagnosis not present

## 2022-09-01 DIAGNOSIS — M40204 Unspecified kyphosis, thoracic region: Secondary | ICD-10-CM | POA: Diagnosis not present

## 2022-09-01 DIAGNOSIS — Z0000 Encounter for general adult medical examination without abnormal findings: Secondary | ICD-10-CM | POA: Diagnosis not present

## 2022-09-01 DIAGNOSIS — Z6820 Body mass index (BMI) 20.0-20.9, adult: Secondary | ICD-10-CM | POA: Diagnosis not present

## 2022-10-04 DIAGNOSIS — M546 Pain in thoracic spine: Secondary | ICD-10-CM | POA: Diagnosis not present

## 2022-10-07 DIAGNOSIS — M546 Pain in thoracic spine: Secondary | ICD-10-CM | POA: Diagnosis not present

## 2022-10-12 DIAGNOSIS — M546 Pain in thoracic spine: Secondary | ICD-10-CM | POA: Diagnosis not present

## 2022-10-13 DIAGNOSIS — M546 Pain in thoracic spine: Secondary | ICD-10-CM | POA: Diagnosis not present

## 2022-10-18 DIAGNOSIS — M546 Pain in thoracic spine: Secondary | ICD-10-CM | POA: Diagnosis not present

## 2022-10-20 DIAGNOSIS — M546 Pain in thoracic spine: Secondary | ICD-10-CM | POA: Diagnosis not present

## 2022-10-25 DIAGNOSIS — M546 Pain in thoracic spine: Secondary | ICD-10-CM | POA: Diagnosis not present

## 2022-10-26 DIAGNOSIS — M8588 Other specified disorders of bone density and structure, other site: Secondary | ICD-10-CM | POA: Diagnosis not present

## 2022-10-26 DIAGNOSIS — Z1231 Encounter for screening mammogram for malignant neoplasm of breast: Secondary | ICD-10-CM | POA: Diagnosis not present

## 2022-10-28 DIAGNOSIS — M546 Pain in thoracic spine: Secondary | ICD-10-CM | POA: Diagnosis not present

## 2022-11-01 DIAGNOSIS — M546 Pain in thoracic spine: Secondary | ICD-10-CM | POA: Diagnosis not present

## 2022-11-03 DIAGNOSIS — M546 Pain in thoracic spine: Secondary | ICD-10-CM | POA: Diagnosis not present

## 2023-02-06 ENCOUNTER — Encounter: Payer: Self-pay | Admitting: Cardiovascular Disease

## 2023-02-06 NOTE — Progress Notes (Unsigned)
Cardiology Office Note:    Date:  02/07/2023   ID:  Diana Stephens, DOB [birth year], MRN [id number]  PCP:  Diana Floro, MD  [facility] Cardiologist:  Diana Miss, MD  [facility] Electrophysiologist:  None   Referring MD: Diana Floro, MD   Chief Complaint  Patient presents with   Hypertension          Oct. 4, 2021   Diana Stephens is a 77 y.o. female with a hx of intermittent chest pain . We are asked to see her for further evaluation of her chest pain by Dr. Tenny Craw.  She has been under lots of stress taking care of husband , Diana Stephens ,- who has progressive dementia . She recently saw Dr. Tenny Craw.  BP was elevated at the time. Has had some shooting chest pain in the remote past .  Only lasts for a second or so  Her kids are telling her that she needs to move into a smaller house.   She used to go to the gym 5 days a week but has not been going out much .   Has to stay with her husband.  Encouraged her to get out and exercise more   Jan. 6, 2022: Diana Stephens is seen today for follow up of her HTN Lots of stress over Christmas holidays  Has been taking her BP  Diana Stephens is getting more demented. 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  July 13, 2021 Diana Stephens is seen today for follow up of her HTN   Is concerned about some left breast pain , pain seemed to move around in her breast  Recent mamogram looked ok  Exam was ok   Has not had any chest tightness.  Has been under stress - had major water damage from a malfunctioning washing machine.   Her BP has been well controlled  Is not going to the gym anymore - is too busy taking care of the house and Diana Stephens ( severe alzheimer's now)  Has had some falls over the past several years. Has had some nerve injury in her leg   Oct. 28, 2024  Diana Stephens is seen for follow up of her chest pain  Spending lots of time taking care of her husband Diana Stephens   Is having some chest pain   Fam hx    Father died of   Past Medical History:  Diagnosis Date   Arthritis    Cataract    small bilaterally   Colon polyps    Osteoporosis     Past Surgical History:  Procedure Laterality Date   ACOUSTIC NEUROMA RESECTION  2008   COLONOSCOPY  04-29-2003   tics   TUBAL LIGATION  1976   VAGINAL DELIVERY     x2- 72,76    Current Medications: Current Meds  Medication Sig   Calcium Carb-Cholecalciferol (CALCIUM 600 + D) 600-5 MG-MCG TABS 1 tablet daily.   Cyanocobalamin (VITAMIN B 12 PO) Take 1 capsule by mouth daily.     Allergies:   Alendronate   Social History   Socioeconomic History   Marital status: Married    Spouse name: Not on file   Number of children: 2   Years of education: Not on file   Highest education level: Not on file  Occupational History   Occupation: retired  Tobacco Use   Smoking status: Former   Smokeless tobacco: Never  Vaping Use   Vaping status: Never Used  Substance and Sexual Activity   Alcohol use: Yes    Alcohol/week: 2.0 standard drinks of alcohol    Types: 2 Standard drinks or equivalent per week    Comment:  1-2 glass of wine a week    Drug use: No   Sexual activity: Not on file  Other Topics Concern   Not on file  Social History Narrative   Not on file   Social Determinants of Health   Financial Resource Strain: Not on file  Food Insecurity: Not on file  Transportation Needs: Not on file  Physical Activity: Not on file  Stress: Not on file  Social Connections: Not on file     Family History: The patient's family history includes Alzheimer's disease in her mother; Diabetes in her father and mother; Heart disease in her father. There is no history of Colon cancer, Colon polyps, Esophageal cancer, Rectal cancer, or Stomach cancer.  ROS:   Please see the history of present illness.     All other systems reviewed and are negative.  EKGs/Labs/Other Studies Reviewed:        Recent Labs: No results found for requested labs  within last 365 days.  Recent Lipid Panel No results found for: "CHOL", "TRIG", "HDL", "CHOLHDL", "VLDL", "LDLCALC", "LDLDIRECT"  Physical Exam:     Physical Exam: Blood pressure 130/70, pulse 71, height 5' 3.5" (1.613 m), weight 112 lb 9.6 oz (51.1 kg), SpO2 98%.       GEN:  Well nourished, well developed in no acute distress HEENT: Normal NECK: No JVD; No carotid bruits LYMPHATICS: No lymphadenopathy CARDIAC: RRR  very soft systolic murmur  RESPIRATORY:  Clear to auscultation without rales, wheezing or rhonchi  ABDOMEN: Soft, non-tender, non-distended MUSCULOSKELETAL:  No edema; No deformity  SKIN: Warm and dry NEUROLOGIC:  Alert and oriented x 3     EKG:     EKG Interpretation Date/Time:  Monday February 07 2023 14:31:14 EDT Ventricular Rate:  70 PR Interval:  126 QRS Duration:  72 QT Interval:  390 QTC Calculation: 421 R Axis:   80  Text Interpretation: Normal sinus rhythm Normal ECG No previous ECGs available Confirmed by Diana Stephens ([id number]) on 02/07/2023 2:32:29 PM     ASSESSMENT:    1. PVC (premature ventricular contraction)     PLAN:    Mildly hypertension:  BP is well controlled    2.  Premature ventricular contractions.    3.  Chest pain:  somewhat atypical .   She will call if the CP worsens    Medication Adjustments/Labs and Tests Ordered: Current medicines are reviewed at length with the patient today.  Concerns regarding medicines are outlined above.  Orders Placed This Encounter  Procedures   EKG 12-Lead   No orders of the defined types were placed in this encounter.   There are no Patient Instructions on file for this visit.   Signed, Diana Miss, MD  02/07/2023 3:09 PM    [facility]

## 2023-02-07 ENCOUNTER — Ambulatory Visit: Payer: Medicare HMO | Attending: Cardiovascular Disease | Admitting: Cardiovascular Disease

## 2023-02-07 ENCOUNTER — Encounter: Payer: Self-pay | Admitting: Cardiovascular Disease

## 2023-02-07 VITALS — BP 130/70 | HR 71 | Ht 63.5 in | Wt 112.6 lb

## 2023-02-07 DIAGNOSIS — I493 Ventricular premature depolarization: Secondary | ICD-10-CM

## 2023-02-07 NOTE — Patient Instructions (Signed)
Follow-Up: At [facility], you and your health needs are our priority.  As part of our continuing mission to provide you with exceptional heart care, we have created designated Provider Care Teams.  These Care Teams include your primary Cardiologist (physician) and Advanced Practice Providers (APPs -  Physician Assistants and Nurse Practitioners) who all work together to provide you with the care you need, when you need it.  Your next appointment:   1 year(s)  Provider:   Chilton Si, MD

## 2023-02-10 DIAGNOSIS — Z961 Presence of intraocular lens: Secondary | ICD-10-CM | POA: Diagnosis not present

## 2023-04-11 DIAGNOSIS — Z6820 Body mass index (BMI) 20.0-20.9, adult: Secondary | ICD-10-CM | POA: Diagnosis not present

## 2023-04-11 DIAGNOSIS — Z01419 Encounter for gynecological examination (general) (routine) without abnormal findings: Secondary | ICD-10-CM | POA: Diagnosis not present

## 2023-09-08 DIAGNOSIS — R944 Abnormal results of kidney function studies: Secondary | ICD-10-CM | POA: Diagnosis not present

## 2023-09-23 DIAGNOSIS — Z0000 Encounter for general adult medical examination without abnormal findings: Secondary | ICD-10-CM | POA: Diagnosis not present

## 2023-09-23 DIAGNOSIS — J449 Chronic obstructive pulmonary disease, unspecified: Secondary | ICD-10-CM | POA: Diagnosis not present

## 2023-09-23 DIAGNOSIS — Z6820 Body mass index (BMI) 20.0-20.9, adult: Secondary | ICD-10-CM | POA: Diagnosis not present

## 2024-01-10 DIAGNOSIS — Z1231 Encounter for screening mammogram for malignant neoplasm of breast: Secondary | ICD-10-CM | POA: Diagnosis not present

## 2024-01-30 ENCOUNTER — Encounter (HOSPITAL_BASED_OUTPATIENT_CLINIC_OR_DEPARTMENT_OTHER): Payer: Self-pay

## 2024-02-02 ENCOUNTER — Ambulatory Visit (HOSPITAL_BASED_OUTPATIENT_CLINIC_OR_DEPARTMENT_OTHER): Admitting: Cardiovascular Disease

## 2024-02-02 NOTE — Progress Notes (Deleted)
  Cardiology Office Note:  .   Date:  02/02/2024  ID:  Diana Stephens, DOB 1945-11-30, MRN [id number] PCP: Okey Carlin Redbird, MD  [facility] Providers Cardiologist:  Aleene Passe, MD (Inactive) { Click to update primary MD,subspecialty MD or APP then REFRESH:1}   History of Present Illness: .   Diana Stephens is a 78 y.o. female with hypertension and PVCs here for follow-up.  She was previously a patient of Dr. Calhoun.  She said chest wall pain that was thought to be musculoskeletal.  She struggled to care for her husband with dementia.  Discussed the use of AI scribe software for clinical note transcription with the patient, who gave verbal consent to proceed.  History of Present Illness     ROS:  As per HPI  Studies Reviewed: .        *** Risk Assessment/Calculations:   {Does this patient have ATRIAL FIBRILLATION?:[phone]} No BP recorded.  {Refresh Note OR Click here to enter BP  :1}***       Physical Exam:   VS:  There were no vitals taken for this visit. , BMI There is no height or weight on file to calculate BMI. GENERAL:  Well appearing HEENT: Pupils equal round and reactive, fundi not visualized, oral mucosa unremarkable NECK:  No jugular venous distention, waveform within normal limits, carotid upstroke brisk and symmetric, no bruits, no thyromegaly LYMPHATICS:  No cervical adenopathy LUNGS:  Clear to auscultation bilaterally HEART:  RRR.  PMI not displaced or sustained,S1 and S2 within normal limits, no S3, no S4, no clicks, no rubs, *** murmurs ABD:  Flat, positive bowel sounds normal in frequency in pitch, no bruits, no rebound, no guarding, no midline pulsatile mass, no hepatomegaly, no splenomegaly EXT:  2 plus pulses throughout, no edema, no cyanosis no clubbing SKIN:  No rashes no nodules NEURO:  Cranial nerves II through XII grossly intact, motor grossly intact throughout PSYCH:  Cognitively intact, oriented to person place and  time   ASSESSMENT AND PLAN: .   *** Assessment and Plan Assessment & Plan         {Are you ordering a CV Procedure (e.g. stress test, cath, DCCV, TEE, etc)?   Press F2        :[id number]}  Dispo: ***  Signed, Annabella Scarce, MD

## 2024-02-10 DIAGNOSIS — Z961 Presence of intraocular lens: Secondary | ICD-10-CM | POA: Diagnosis not present

## 2024-04-27 ENCOUNTER — Ambulatory Visit (HOSPITAL_BASED_OUTPATIENT_CLINIC_OR_DEPARTMENT_OTHER): Admitting: Cardiovascular Disease

## 2024-04-27 ENCOUNTER — Encounter (HOSPITAL_BASED_OUTPATIENT_CLINIC_OR_DEPARTMENT_OTHER): Payer: Self-pay | Admitting: Cardiovascular Disease

## 2024-04-27 VITALS — BP 154/84 | HR 70 | Resp 17 | Ht 63.0 in | Wt 114.0 lb

## 2024-04-27 DIAGNOSIS — I10 Essential (primary) hypertension: Secondary | ICD-10-CM | POA: Diagnosis not present

## 2024-04-27 DIAGNOSIS — I493 Ventricular premature depolarization: Secondary | ICD-10-CM | POA: Diagnosis not present

## 2024-04-27 NOTE — Patient Instructions (Signed)
 Medication Instructions:  Your physician recommends that you continue on your current medications as directed. Please refer to the Current Medication list given to you today.  *If you need a refill on your cardiac medications before your next appointment, please call your pharmacy*  Lab Work: NONE  Testing/Procedures: NONE   Follow-Up: At [facility], you and your health needs are our priority.  As part of our continuing mission to provide you with exceptional heart care, our providers are all part of one team.  This team includes your primary Cardiologist (physician) and Advanced Practice Providers or APPs (Physician Assistants and Nurse Practitioners) who all work together to provide you with the care you need, when you need it.  Your next appointment:   12 month(s)  Provider:   Annabella Scarce, MD, Rosaline Bane, NP, or Reche Finder, NP    We recommend signing up for the patient portal called MyChart.  Sign up information is provided on this After Visit Summary.  MyChart is used to connect with patients for Virtual Visits (Telemedicine).  Patients are able to view lab/test results, encounter notes, upcoming appointments, etc.  Non-urgent messages can be sent to your provider as well.   To learn more about what you can do with MyChart, go to [email or web address].   Other Instructions MONITOR YOUR BLOOD PRESSURE TWICE A DAY AND BRING YOUR READINGS TO YOUR FOLLOW UP APPOINTMENT WITH YOUR PRIMARY CARE

## 2024-04-27 NOTE — Progress Notes (Signed)
 " Cardiology Office Note:  .   Date:  04/27/2024  ID:  Diana Stephens, DOB Jul 30, 1945, MRN [id number] PCP: Okey Carlin Redbird, MD   [facility] Providers Cardiologist:  Aleene Passe, MD (Inactive)    History of Present Illness: .   Diana Stephens is a 79 y.o. female with COPD, hypertension, remote tobacco abuse,  and PVCs here for follow up.  She was previously a patient of Dr. Passe.  She has  worked with him on her hypertension and has been seen for episodes of chest pain.  He also noted her stress from caring for her husband who has dementia.   Discussed the use of AI scribe software for clinical note transcription with the patient, who gave verbal consent to proceed.  History of Present Illness Diana Stephens reports that she has not monitored her blood pressure recently due to caregiving responsibilities for her husband. Previously, her blood pressure readings were occasionally high, reaching the 150s, but typically around 135-138 mmHg. She has not been on any medication for hypertension.  She experiences anxiety and occasional non-specific chest sensations, which she associates with stress. These sensations do not occur with physical exertion, such as climbing stairs, but rather when she is upset. No chest pain or discomfort during physical activity.  She has a history of COPD, diagnosed by a previous physician, but remains active, having previously engaged in regular exercise such as gym workouts and pickleball. She notes a change in her breathing when climbing stairs, which she attributes to COPD.  Her social history includes being a former smoker, having quit over 40 years ago. She is the primary caregiver for her husband, who has Alzheimer's disease and multiple other health issues, contributing to her stress. Her diet is generally consistent, though she notes a preference for softer foods due to her husband's dietary needs. She maintains a stable weight around 112-113 pounds  and occasionally treats herself to coffee and ice cream at night.  ROS:  As per HPI  Studies Reviewed: SABRA   EKG Interpretation Date/Time:  Friday April 27 2024 09:58:02 EST Ventricular Rate:  72 PR Interval:  120 QRS Duration:  72 QT Interval:  388 QTC Calculation: 424 R Axis:   35  Text Interpretation: Normal sinus rhythm Normal ECG When compared with ECG of 07-Feb-2023 14:31, No significant change was found Confirmed by Raford Riggs ([id number]) on 04/27/2024 10:07:22 AM     Risk Assessment/Calculations:     HYPERTENSION CONTROL Vitals:   04/27/24 0956 04/27/24 1032  BP: (!) 156/84 (!) 154/84    The patient's blood pressure is elevated above target today.  In order to address the patient's elevated BP: Blood pressure will be monitored at home to determine if medication changes need to be made.          Physical Exam:   VS:  BP (!) 154/84   Pulse 70   Resp 17   Ht 5' 3 (1.6 m)   Wt 114 lb (51.7 kg)   SpO2 97%   BMI 20.19 kg/m  , BMI Body mass index is 20.19 kg/m. GENERAL:  Well appearing HEENT: Pupils equal round and reactive, fundi not visualized, oral mucosa unremarkable NECK:  No jugular venous distention, waveform within normal limits, carotid upstroke brisk and symmetric, no bruits, no thyromegaly LUNGS:  Clear to auscultation bilaterally HEART:  RRR.  PMI not displaced or sustained,S1 and S2 within normal limits, no S3, no S4, no clicks, no rubs, no murmurs ABD:  Flat, positive  bowel sounds normal in frequency in pitch, no bruits, no rebound, no guarding, no midline pulsatile mass, no hepatomegaly, no splenomegaly EXT:  2 plus pulses throughout, no edema, no cyanosis no clubbing SKIN:  No rashes no nodules NEURO:  Cranial nerves II through XII grossly intact, motor grossly intact throughout PSYCH:  Cognitively intact, oriented to person place and time   ASSESSMENT AND PLAN: .    Assessment & Plan # Primary hypertension Blood pressure elevated at  154/84 mmHg. She had an exceptionally stressful day. Her husband is home on hospice and she was late after going to the wrong office this am.  I asked her to track bid at home and bring to her PCP later this month.  I suspect she may needs meds.  Previous home readings 135-138 mmHg with spikes to 150s. No antihypertensive medication. Stress and lifestyle factors may contribute.  Her goal is <130/80. - Track blood pressure at home twice daily for two weeks. - Bring readings to primary care appointment at [facility] end.      Dispo: f/u 1 year per patient request  Signed, Annabella Scarce, MD   "
